# Patient Record
Sex: Female | Born: 1970 | Hispanic: No | Marital: Married | Smoking: Never smoker
Health system: Southern US, Community
[De-identification: ages and names within clinical notes are randomized; demographics above are authoritative.]

## PROBLEM LIST (undated history)

## (undated) DIAGNOSIS — I10 Essential (primary) hypertension: Secondary | ICD-10-CM

## (undated) DIAGNOSIS — E785 Hyperlipidemia, unspecified: Secondary | ICD-10-CM

## (undated) HISTORY — DX: Hyperlipidemia, unspecified: E78.5

## (undated) HISTORY — DX: Essential (primary) hypertension: I10

## (undated) HISTORY — PX: ABDOMINAL HYSTERECTOMY: SHX81

---

## 2000-05-14 ENCOUNTER — Encounter: Admission: RE | Admit: 2000-05-14 | Discharge: 2000-05-14 | Payer: Self-pay | Admitting: Obstetrics

## 2000-05-22 ENCOUNTER — Ambulatory Visit (HOSPITAL_COMMUNITY): Admission: RE | Admit: 2000-05-22 | Discharge: 2000-05-22 | Payer: Self-pay | Admitting: Obstetrics

## 2000-06-11 ENCOUNTER — Encounter: Admission: RE | Admit: 2000-06-11 | Discharge: 2000-06-11 | Payer: Self-pay | Admitting: Obstetrics

## 2000-08-09 ENCOUNTER — Emergency Department (HOSPITAL_COMMUNITY): Admission: EM | Admit: 2000-08-09 | Discharge: 2000-08-09 | Payer: Self-pay | Admitting: Emergency Medicine

## 2012-11-30 ENCOUNTER — Emergency Department (HOSPITAL_COMMUNITY): Payer: Self-pay

## 2012-11-30 ENCOUNTER — Encounter (HOSPITAL_COMMUNITY): Payer: Self-pay | Admitting: Emergency Medicine

## 2012-11-30 ENCOUNTER — Emergency Department (HOSPITAL_COMMUNITY)
Admission: EM | Admit: 2012-11-30 | Discharge: 2012-11-30 | Disposition: A | Discharge status: Home / Self Care | Payer: Self-pay | Attending: Emergency Medicine | Admitting: Emergency Medicine

## 2012-11-30 DIAGNOSIS — IMO0002 Reserved for concepts with insufficient information to code with codable children: Secondary | ICD-10-CM | POA: Insufficient documentation

## 2012-11-30 DIAGNOSIS — M549 Dorsalgia, unspecified: Secondary | ICD-10-CM

## 2012-11-30 MED ORDER — CYCLOBENZAPRINE HCL 10 MG PO TABS
10.0000 mg | ORAL_TABLET | Freq: Once | ORAL | Status: AC
  Administered 2012-11-30: 10 mg via ORAL
  Filled 2012-11-30: qty 1

## 2012-11-30 MED ORDER — HYDROCODONE-ACETAMINOPHEN 5-325 MG PO TABS
2.0000 | ORAL_TABLET | Freq: Once | ORAL | Status: AC
  Administered 2012-11-30: 2 via ORAL
  Filled 2012-11-30: qty 2

## 2012-11-30 MED ORDER — CYCLOBENZAPRINE HCL 10 MG PO TABS: 10.0000 mg | ORAL_TABLET | Freq: Two times a day (BID) | ORAL | Status: DC | PRN

## 2012-11-30 MED ORDER — NAPROXEN 500 MG PO TABS: 500.0000 mg | ORAL_TABLET | Freq: Two times a day (BID) | ORAL | Status: DC

## 2012-11-30 NOTE — ED Notes (Signed)
[employer] in room with patient  
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
## 2012-11-30 NOTE — ED Notes (Signed)
Patient in radiology

## 2012-11-30 NOTE — ED Provider Notes (Signed)
  Medical screening examination/treatment/procedure(s) were performed by non-physician practitioner and as supervising physician I was immediately available for consultation/collaboration.      Gerhard Munch, MD 11/30/12 [phone]

## 2012-11-30 NOTE — ED Notes (Signed)
Called security to have [employer] office come by and speak with patient.

## 2013-01-20 ENCOUNTER — Emergency Department (HOSPITAL_COMMUNITY)
Admission: EM | Admit: 2013-01-20 | Discharge: 2013-01-20 | Disposition: A | Discharge status: Home / Self Care | Payer: Self-pay | Attending: Emergency Medicine | Admitting: Emergency Medicine

## 2013-01-20 ENCOUNTER — Encounter (HOSPITAL_COMMUNITY): Payer: Self-pay | Admitting: Emergency Medicine

## 2013-01-20 DIAGNOSIS — M62838 Other muscle spasm: Secondary | ICD-10-CM | POA: Insufficient documentation

## 2013-01-20 DIAGNOSIS — M542 Cervicalgia: Secondary | ICD-10-CM | POA: Insufficient documentation

## 2013-01-20 DIAGNOSIS — S46819A Strain of other muscles, fascia and tendons at shoulder and upper arm level, unspecified arm, initial encounter: Secondary | ICD-10-CM

## 2013-01-20 DIAGNOSIS — S239XXA Sprain of unspecified parts of thorax, initial encounter: Secondary | ICD-10-CM | POA: Insufficient documentation

## 2013-01-20 MED ORDER — CYCLOBENZAPRINE HCL 10 MG PO TABS: 10.0000 mg | ORAL_TABLET | Freq: Two times a day (BID) | ORAL | Status: DC | PRN

## 2013-01-20 MED ORDER — NAPROXEN 500 MG PO TABS: 500.0000 mg | ORAL_TABLET | Freq: Two times a day (BID) | ORAL | Status: DC
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
## 2013-01-20 NOTE — Discharge Instructions (Signed)
1. Medications: flexeril, naproxyn, usual home medications 2. Treatment: rest, drink plenty of fluids, gentle stretching as discussed, alternate ice and heat 3. Follow Up: Please followup with your primary doctor for discussion of your diagnoses and further evaluation after today's visit; if you do not have a primary care doctor use the resource guide provided to find one;
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
## 2013-01-20 NOTE — ED Notes (Signed)
Spanish interpreter used for discharge
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
## 2013-01-21 NOTE — ED Provider Notes (Signed)
Medical screening examination/treatment/procedure(s) were performed by non-physician practitioner and as supervising physician I was immediately available for consultation/collaboration.  EKG Interpretation   None         Dagmar HaitWilliam Joel Cowin, MD 01/21/13 [phone]

## 2013-02-16 ENCOUNTER — Encounter (INDEPENDENT_AMBULATORY_CARE_PROVIDER_SITE_OTHER): Payer: Self-pay

## 2013-02-16 ENCOUNTER — Ambulatory Visit (INDEPENDENT_AMBULATORY_CARE_PROVIDER_SITE_OTHER): Payer: Self-pay | Admitting: Emergency Medicine

## 2013-02-16 ENCOUNTER — Encounter: Payer: Self-pay | Admitting: Emergency Medicine

## 2013-02-16 VITALS — BP 174/94 | HR 86 | Wt 207.0 lb

## 2013-02-16 DIAGNOSIS — M62838 Other muscle spasm: Secondary | ICD-10-CM

## 2013-02-16 MED ORDER — NAPROXEN 500 MG PO TABS: 500.0000 mg | ORAL_TABLET | Freq: Two times a day (BID) | ORAL | Status: DC

## 2013-02-16 MED ORDER — METHOCARBAMOL 500 MG PO TABS: 500.0000 mg | ORAL_TABLET | Freq: Four times a day (QID) | ORAL | Status: DC

## 2013-02-16 NOTE — Assessment & Plan Note (Signed)
Patient given prescription for Robaxin and Naprosyn. In addition she was given home exercise programs for both general and shoulder strengthening as well as back strengthening to help alleviate the muscle spasm in her trapezius muscle. Finally she was given a protrusion for physical therapy. Was also recommended that she [facility] with [facility] to consider obtaining an orange card for assistance with her health insurance.
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
## 2013-03-01 ENCOUNTER — Ambulatory Visit: Payer: Self-pay | Attending: Emergency Medicine | Admitting: Physical Therapy

## 2014-01-20 ENCOUNTER — Ambulatory Visit: Payer: Self-pay

## 2014-04-19 ENCOUNTER — Ambulatory Visit: Payer: Self-pay

## 2017-04-10 LAB — GLUCOSE, POCT (MANUAL RESULT ENTRY): POC Glucose: 88 mg/dl (ref 70–99)

## 2019-03-27 ENCOUNTER — Other Ambulatory Visit: Payer: Self-pay

## 2019-03-27 DIAGNOSIS — Z20822 Contact with and (suspected) exposure to covid-19: Secondary | ICD-10-CM

## 2019-03-28 LAB — NOVEL CORONAVIRUS, NAA: SARS-CoV-2, NAA: DETECTED — AB

## 2019-03-29 ENCOUNTER — Telehealth: Payer: Self-pay | Admitting: Unknown Physician Specialty

## 2019-03-29 ENCOUNTER — Telehealth: Payer: Self-pay | Admitting: *Deleted

## 2019-03-29 ENCOUNTER — Other Ambulatory Visit: Payer: Self-pay | Admitting: Adult Health

## 2019-03-29 DIAGNOSIS — U071 COVID-19: Secondary | ICD-10-CM

## 2019-03-29 NOTE — Progress Notes (Signed)
  I connected by phone with Kristen Peters via spanish interpreter [phone], on 03/29/2019 at 6:56 PM to discuss the potential use of an new treatment for mild to moderate COVID-19 viral infection in non-hospitalized patients.  This patient is a 49 y.o. female that meets the FDA criteria for Emergency Use Authorization of bamlanivimab or casirivimab\imdevimab.  Has a (+) direct SARS-CoV-2 viral test result  Has mild or moderate COVID-19   Is ? 51 years of age and weighs ? 40 kg  Is NOT hospitalized due to COVID-19  Is NOT requiring oxygen therapy or requiring an increase in baseline oxygen flow rate due to COVID-19  Is within 10 days of symptom onset  Has at least one of the high risk factor(s) for progression to severe COVID-19 and/or hospitalization as defined in EUA.  Specific high risk criteria : BMI >/= 35   Symptom onset 3/20  Tested positive on 3/21   I have spoken and communicated the following to the patient or parent/caregiver:  1. FDA has authorized the emergency use of bamlanivimab and casirivimab\imdevimab for the treatment of mild to moderate COVID-19 in adults and pediatric patients with positive results of direct SARS-CoV-2 viral testing who are 7 years of age and older weighing at least 40 kg, and who are at high risk for progressing to severe COVID-19 and/or hospitalization.  2. The significant known and potential risks and benefits of bamlanivimab and casirivimab\imdevimab, and the extent to which such potential risks and benefits are unknown.  3. Information on available alternative treatments and the risks and benefits of those alternatives, including clinical trials.  4. Patients treated with bamlanivimab and casirivimab\imdevimab should continue to self-isolate and use infection control measures (e.g., wear mask, isolate, social distance, avoid sharing personal items, clean and disinfect "high touch" surfaces, and frequent handwashing) according to CDC  guidelines.   5. The patient or parent/caregiver has the option to accept or refuse bamlanivimab or casirivimab\imdevimab .  After reviewing this information with the patient, The patient agreed to proceed with receiving the bamlanimivab infusion and will be provided a copy of the Fact sheet prior to receiving the infusion.Noreene Filbert 03/29/2019 6:56 PM

## 2019-03-29 NOTE — Telephone Encounter (Signed)
Assisted by Audelia Hives, Interpreter # [phone]; Pt given result of COVID test; tier of symptoms reviewed; instructions given to pt: Self-Isolation, Ending Isolation, ED/UC . remain in self-quarantine until they meet the "Non-Test Criteria for Ending Self-Isolation". Non-Test Criteria for Ending Self-Isolation All persons with fever and respiratory symptoms should isolate themselves until ALL conditions listed below are met: - at least 10 days since symptoms onset - AND 3 consecutive days fever free without antipyretics (acetaminophen [Tylenol] or ibuprofen [Advil]) - AND improvement in respiratory symptoms . If the patient develops respiratory issues/distress, seek medical care in the Emergency Department, call 911, reports symptoms and report COVID-19 positive test. Patient Instructions . continue to utilize over-the-counter medications for fever (ibuprofen and/or Tylenol) and cough (cough medicine and/or sore throat lozenges). Wannetta Sender the patient to wear a mask around people and follow good infection prevention techniques. . Patient to should only leave home to seek medical care. . send family for food, prescriptions or medicines; or use delivery service.  . If the patient must leave the home, they must wear a mask in public. Marland Kitchen limit contact with immediate family members or caregivers in the home, and use mask, social distancing, and handwashing to decrease risk to patients. o Please continue good preventive care measures, including frequent hand washing, avoid touching your face, cover coughs/sneezes with tissue or into elbow, stay out of crowds and keep a 6-foot distance from others.    patient and family to clean hard surfaces touched by patient frequently with household cleaning products. Pt advised to notify her PCP; she was also informed the [place] was notified; she verbalized understanding; she would like to know when she can be vaccinated; pt advised that she can not be  vaccinated until she is out of her quarantine period, and she is not having symptoms; she again verbalized understanding; [facility] Dept previously notified.

## 2019-03-29 NOTE — Telephone Encounter (Signed)
Using language interpreter called to discuss with patient about Covid symptoms and the use of bamlanivimab, a monoclonal antibody infusion for those with mild to moderate Covid symptoms and at a high risk of hospitalization. Not enough information to see if patient is qualified for mab.  Did not leave message as using interpreter

## 2019-03-30 ENCOUNTER — Ambulatory Visit (HOSPITAL_COMMUNITY)
Admission: RE | Admit: 2019-03-30 | Discharge: 2019-03-30 | Disposition: A | Discharge status: Home / Self Care | Payer: HRSA Program | Source: Ambulatory Visit | Attending: Pulmonary Disease | Admitting: Pulmonary Disease

## 2019-03-30 DIAGNOSIS — U071 COVID-19: Secondary | ICD-10-CM | POA: Diagnosis present

## 2019-03-30 MED ORDER — ALBUTEROL SULFATE HFA 108 (90 BASE) MCG/ACT IN AERS: 2.0000 | INHALATION_SPRAY | Freq: Once | RESPIRATORY_TRACT | Status: DC | PRN

## 2019-03-30 MED ORDER — EPINEPHRINE 0.3 MG/0.3ML IJ SOAJ: 0.3000 mg | Freq: Once | INTRAMUSCULAR | Status: DC | PRN

## 2019-03-30 MED ORDER — METHYLPREDNISOLONE SODIUM SUCC 125 MG IJ SOLR: 125.0000 mg | Freq: Once | INTRAMUSCULAR | Status: DC | PRN

## 2019-03-30 MED ORDER — SODIUM CHLORIDE 0.9 % IV SOLN
700.0000 mg | Freq: Once | INTRAVENOUS | Status: AC
  Administered 2019-03-30: 13:00:00 700 mg via INTRAVENOUS
  Filled 2019-03-30: qty 700

## 2019-03-30 MED ORDER — DIPHENHYDRAMINE HCL 50 MG/ML IJ SOLN: 50.0000 mg | Freq: Once | INTRAMUSCULAR | Status: DC | PRN

## 2019-03-30 MED ORDER — FAMOTIDINE IN NACL 20-0.9 MG/50ML-% IV SOLN: 20.0000 mg | Freq: Once | INTRAVENOUS | Status: DC | PRN

## 2019-03-30 MED ORDER — SODIUM CHLORIDE 0.9 % IV SOLN
INTRAVENOUS | Status: DC | PRN
  Administered 2019-03-30: 13:00:00 250 mL via INTRAVENOUS

## 2019-03-30 NOTE — Progress Notes (Addendum)
  Diagnosis: COVID-19  Physician: Dr Wright  Procedure: [facility]fusion [facility] Med: bamlanivimab infusion - Provided patient with bamlanimivab fact sheet for patients, parents and caregivers prior to infusion.  Complications: No immediate complications noted.  Discharge: Discharged home   Kristen Peters 03/30/2019  

## 2019-03-30 NOTE — Discharge Instructions (Signed)
COVID-19 COVID-19 El COVID-19 es una infeccin respiratoria causada por un virus llamado coronavirus tipo 2 causante del sndrome respiratorio agudo grave (SARS-CoV-2). La enfermedad tambin se conoce como enfermedad por coronavirus o nuevo coronavirus. En algunas personas, el virus puede no ocasionar sntomas. En otras, puede producir una infeccin grave. La infeccin puede empeorar rpidamente y causar complicaciones, como:  Neumona o infeccin en los pulmones.  Sndrome de dificultad respiratoria aguda o SDRA. Es una afeccin que se caracteriza por la acumulacin de lquido en los pulmones, que impide que los pulmones se llenen de aire y pasen oxgeno a la sangre.  Insuficiencia respiratoria aguda. Es una afeccin que se caracteriza porque no pasa suficiente oxgeno de los pulmones al cuerpo o porque el dixido de carbono no pasa de los pulmones hacia afuera del cuerpo.  Sepsis o choque sptico. Se trata de una reaccin grave del cuerpo ante una infeccin.  Problemas de coagulacin.  Infecciones secundarias debido a bacterias u hongos.  Falla de rganos. Ocurre cuando los rganos del cuerpo dejan de funcionar. El virus que causa el COVID-19 es contagioso. Esto significa que puede transmitirse de una persona a otra a travs de las gotitas de saliva de la tos y de los estornudos (secreciones respiratorias). Cules son las causas? Esta enfermedad es causada por un virus. Usted puede contagiarse con este virus:  Al inspirar las gotitas de una persona infectada. Las gotitas pueden diseminarse cuando una persona respira, habla, canta, tose o estornuda.  Al tocar algo, como una mesa o el picaportes de una puerta, que estuvo expuesto al virus (contaminado) y luego tocarse la boca, nariz o los ojos. Qu incrementa el riesgo? Riesgo de infeccin Es ms probable que se infecte con este virus si:  Se encuentra a una distancia menor a 6 pies (2 metros) de una persona con COVID-19.  Cuida o  vive con una persona infectada con COVID-19.  Pasa tiempo en espacios interiores repletos de gente o vive en viviendas compartidas. Riesgo de enfermedad grave Es ms probable que se enferme gravemente por el virus si:  Tiene 50aos o ms. Cuanto mayor sea su edad, mayor ser el riesgo de tener una enfermedad grave.  Vive en un hogar de ancianos o centro de atencin a largo plazo.  Tiene cncer.  Tiene una enfermedad prolongada (crnica), como las siguientes: ? Enfermedad pulmonar crnica, que incluye la enfermedad pulmonar obstructiva crnica o asma. ? Una enfermedad crnica que disminuye la capacidad del cuerpo para combatir las infecciones (immunocomprometido). ? Enfermedad cardaca, que incluye insuficiencia cardaca, una afeccin que se caracteriza porque las arterias que llegan al corazn se estrechan u obstruyen (arteriopata coronaria) o una enfermedad que provoca que el msculo cardaco se engrose, se debilite o endurezca (miocardiopata). ? Diabetes. ? Enfermedad renal crnica. ? Anemia drepanoctica, una enfermedad que se caracteriza porque los glbulos rojos tienen una forma anormal de "hoz". ? Enfermedad heptica.  Es obeso. Cules son los signos o sntomas? Los sntomas de esta afeccin pueden ser de leves a graves. Los sntomas pueden aparecer en el trmino de 2 a 14 das despus de haber estado expuesto al virus. Incluyen los siguientes:  Fiebre o escalofros.  Tos.  Dificultad para respirar.  Dolores de cabeza, dolores en el cuerpo o dolores musculares.  Secrecin o congestin nasal.  Dolor de garganta.  Nueva prdida del sentido del gusto o del olfato. Algunas personas tambin pueden tener problemas estomacales, como nuseas, vmitos o diarrea. Es posible que otras personas no tengan sntomas de COVID-19.   Cmo se diagnostica? Esta afeccin se puede diagnosticar en funcin de lo siguiente:  Sus signos y sntomas, especialmente si: ? Vive en una zona  donde hay un brote de COVID-19. ? Viaj recientemente a una zona donde el virus es frecuente. ? Cuida o vive con una persona a quien se le diagnostic COVID-19. ? Estuvo expuesto a una persona a la que se le diagnostic COVID-19.  Un examen fsico.  Anlisis de laboratorio que pueden incluir: ? Tomar una muestra de lquido de la parte posterior de la nariz y la garganta (lquido nasofarngeo), la nariz o la garganta, con un hisopo. ? Una muestra de mucosidad de los pulmones (esputo). ? Anlisis de sangre.  Los estudios de diagnstico por imgenes pueden incluir radiografas, exploracin por tomografa computarizada (TC) o ecografa. Cmo se trata? En este momento, no hay ningn medicamento para tratar el COVID-19. Los medicamentos para tratar otras enfermedades se usan a modo de ensayo para comprobar si son eficaces contra el COVID-19. El mdico le informar sobre las maneras de tratar los sntomas. En la mayora de las personas, la infeccin es leve y puede controlarse en el hogar con reposo, lquidos y medicamentos de venta libre. El tratamiento para una infeccin grave suele realizarse en la unidad de cuidados intensivos (UCI) de un hospital. Puede incluir uno o ms de los siguientes. Estos tratamientos se administran hasta que los sntomas mejoran.  Recibir lquidos y medicamentos a travs de una va intravenosa.  Oxgeno complementario. Para administrar oxgeno extra, se utiliza un tubo en la nariz, una mascarilla o una campana de oxgeno.  Colocarlo para que se recueste boca abajo (decbito prono). Esto facilita el ingreso de oxgeno a los pulmones.  Uso continuo de una mquina de presin positiva de las vas areas (CPAP) o de presin positiva de las vas areas de dos niveles (BPAP). Este tratamiento utiliza una presin de aire leve para mantener las vas respiratorias abiertas. Un tubo conectado a un motor administra oxgeno al cuerpo.  Respirador. Este tratamiento mueve el aire  dentro y fuera de los pulmones mediante el uso de un tubo que se coloca en la trquea.  Traqueostoma. En este procedimiento se hace un orificio en el cuello para insertar un tubo de respiracin.  Oxigenacin por membrana extracorprea (OMEC). En este procedimiento, los pulmones tienen la posibilidad de recuperarse al asumir las funciones del corazn y los pulmones. Suministra oxgeno al cuerpo y elimina el dixido de carbono. Siga estas instrucciones en su casa: Estilo de vida  Si est enfermo, qudese en su casa, excepto para obtener atencin mdica. El mdico le indicar cunto tiempo debe quedarse en casa. Llame al mdico antes de buscar atencin mdica.  Haga reposo en su casa como se lo haya indicado el mdico.  No consuma ningn producto que contenga nicotina o tabaco, como cigarrillos, cigarrillos electrnicos y tabaco de mascar. Si necesita ayuda para dejar de fumar, consulte al mdico.  Retome sus actividades normales segn lo indicado por el mdico. Pregntele al mdico qu actividades son seguras para usted. Instrucciones generales  Use los medicamentos de venta libre y los recetados solamente como se lo haya indicado el mdico.  Beba suficiente lquido como para mantener la orina de color amarillo plido.  Concurra a todas las visitas de seguimiento como se lo haya indicado el mdico. Esto es importante. Cmo se evita?  No hay ninguna vacuna que ayude a prevenir la infeccin por COVID-19. Sin embargo, hay medidas que puede tomar para protegerse y proteger a   otras personas de este virus. Para protegerse:   No viaje a zonas donde el COVID-19 sea un riesgo. Las zonas donde se informa la presencia del COVID-19 cambian con frecuencia. Para identificar las zonas de alto riesgo y las restricciones de viaje, consulte el sitio web de viajes de los Centers for Disease Control and Prevention (CDC) (Centros para el Control y la Prevencin de Enfermedades):  wwwnc.cdc.gov/travel/notices  Si vive o debe viajar a una zona donde el COVID-19 es un riesgo, tome precauciones para evitar infecciones. ? Aljese de las personas enfermas. ? Lvese las manos frecuentemente con agua y jabn durante 20segundos. Use desinfectante para manos con alcohol si no dispone de agua y jabn. ? Evite tocarse la boca, la cara, los ojos o la nariz. ? Evite salir de su casa, siga las indicaciones de su estado y de las autoridades sanitarias locales. ? Si debe salir de su casa, use un barbijo de tela o una mascarilla facial. Asegrese de que le cubra la nariz y la boca. ? Evite los espacios interiores repletos de gente. Mantenga una distancia de al menos 6 pies (2 metros) de otras personas. ? Desinfecte los objetos y las superficies que se tocan con frecuencia todos los das. Pueden incluir:  Encimeras y mesas.  Picaportes e interruptores de luz.  Lavabos, fregaderos y grifos.  Aparatos electrnicos tales como telfonos, controles remotos, teclados, computadoras y tabletas. Cmo proteger a los dems: Si tiene sntomas de COVID-19, tome medidas para evitar que el virus se propague a otras personas.  Si cree que tiene una infeccin por COVID-19, comunquese de inmediato con su mdico. Informe al equipo de atencin mdica que cree que puede tener una infeccin por el COVID-19.  Qudese en su casa. Salga de su casa solo para buscar atencin mdica. No utilice el transporte pblico.  No viaje mientras est enfermo.  Lvese las manos frecuentemente con agua y jabn durante 20segundos. Usar desinfectante para manos con alcohol si no dispone de agua y jabn.  Mantngase alejado de quienes vivan con usted. Permita que los miembros de la familia sanos cuiden a los nios y las mascotas, si es posible. Si tiene que cuidar a los nios o las mascotas, lvese las manos con frecuencia y use un barbijo. Si es posible, permanezca en su habitacin, separado de los dems. Utilice un  bao diferente.  Asegrese de que todas las personas que viven en su casa se laven bien las manos y con frecuencia.  Tosa o estornude en un pauelo de papel o sobre su manga o codo. No tosa o estornude al aire ni se cubra la boca o la nariz con la mano.  Use un barbijo de tela o una mascarilla facial. Asegrese de que le cubra la nariz y la boca. Dnde buscar ms informacin  Centers for Disease Control and Prevention (Centros para el Control y la Prevencin de Enfermedades): www.cdc.gov/coronavirus/2019-ncov/index.html  World Health Organization (Organizacin Mundial de la Salud): www.who.int/health-topics/coronavirus Comunquese con un mdico si:  Vive o ha viajado a una zona donde el COVID-19 es un riesgo y tiene sntomas de infeccin.  Ha tenido contacto con alguien que tiene COVID-19 y usted tiene sntomas de infeccin. Solicite ayuda inmediatamente si:  Tiene dificultad para respirar.  Siente dolor u opresin en el pecho.  Experimenta confusin.  Tiene las uas de los dedos y los labios de color azulado.  Tiene dificultad para despertarse.  Los sntomas empeoran. Estos sntomas pueden representar un problema grave que constituye una emergencia. No espere   a ver si los sntomas desaparecen. Solicite atencin mdica de inmediato. Comunquese con el servicio de emergencias de su localidad (911 en los [place]). No conduzca por sus propios medios hasta el hospital. Informe al personal mdico de emergencias si cree que tiene COVID-19. Resumen  El COVID-19 es una infeccin respiratoria causada por un virus. Tambin se conoce como enfermedad por coronavirus o nuevo coronavirus. Puede causar infecciones graves, como neumona, sndrome de dificultad respiratoria aguda, insuficiencia respiratoria aguda o sepsis.  El virus que causa el COVID-19 es contagioso. Esto significa que puede transmitirse de una persona a otra a travs de las gotitas que se despiden al respirar, hablar,  cantar, toser y estornudar.  Es ms probable que desarrolle una enfermedad grave si tiene 50 aos o ms, tiene el sistema inmunitario dbil, vive en un hogar de ancianos o tiene una enfermedad crnica.  No hay ningn medicamento para tratar el COVID-19. El mdico le informar sobre las maneras de tratar los sntomas.  Tome medidas para protegerse y proteger a los dems contra las infecciones. Lvese las manos con frecuencia y desinfecte los objetos y las superficies que se tocan con frecuencia todos los das. Mantngase alejado de las personas que estn enfermas y use un barbijo si est enfermo. Esta informacin no tiene como fin reemplazar el consejo del mdico. Asegrese de hacerle al mdico cualquier pregunta que tenga. Document Revised: 10/28/2018 Document Reviewed: 02/20/2018 Elsevier Patient Education  2020 Elsevier Inc. What types of side effects do monoclonal antibody drugs cause?  Common side effects  In general, the more common side effects caused by monoclonal antibody drugs include: . Allergic reactions, such as hives or itching . Flu-like signs and symptoms, including chills, fatigue, fever, and muscle aches and pains . Nausea, vomiting . Diarrhea . Skin rashes . Low blood pressure   The CDC is recommending patients who receive monoclonal antibody treatments wait at least 90 days before being vaccinated.  Currently, there are no data on the safety and efficacy of mRNA COVID-19 vaccines in persons who received monoclonal antibodies or convalescent plasma as part of COVID-19 treatment. Based on the estimated half-life of such therapies as well as evidence suggesting that reinfection is uncommon in the 90 days after initial infection, vaccination should be deferred for at least 90 days, as a precautionary measure until additional information becomes available, to avoid interference of the antibody treatment with vaccine-induced immune responses.  

## 2019-03-30 NOTE — Progress Notes (Signed)
Patient check-in and procedure explanation completed with assistance from interpreter line.  Interpreter, [place], [place] [address].  Arlyce Harman

## 2019-05-23 ENCOUNTER — Other Ambulatory Visit: Payer: Self-pay

## 2019-05-23 ENCOUNTER — Ambulatory Visit: Payer: Self-pay | Admitting: Physician Assistant

## 2019-05-23 VITALS — BP 143/84 | HR 76 | Temp 98.7°F | Resp 18 | Ht 62.0 in

## 2019-05-23 DIAGNOSIS — Z13220 Encounter for screening for lipoid disorders: Secondary | ICD-10-CM

## 2019-05-23 DIAGNOSIS — Z8639 Personal history of other endocrine, nutritional and metabolic disease: Secondary | ICD-10-CM

## 2019-05-23 DIAGNOSIS — Z1159 Encounter for screening for other viral diseases: Secondary | ICD-10-CM

## 2019-05-23 DIAGNOSIS — I10 Essential (primary) hypertension: Secondary | ICD-10-CM

## 2019-05-23 DIAGNOSIS — Z5989 Other problems related to housing and economic circumstances: Secondary | ICD-10-CM

## 2019-05-23 LAB — POCT GLYCOSYLATED HEMOGLOBIN (HGB A1C): Hemoglobin A1C: 5.4 % (ref 4.0–5.6)

## 2019-05-23 MED ORDER — LISINOPRIL-HYDROCHLOROTHIAZIDE 10-12.5 MG PO TABS: 1.0000 | ORAL_TABLET | Freq: Every day | ORAL | 2 refills | Status: DC

## 2019-05-23 NOTE — Patient Instructions (Addendum)
Please return to the mobile unit tomorrow morning at 9 AM for fasting labs.  I sent the blood pressure medication to your pharmacy, I recommend that you take this starting in the morning, you do not want to take this later in the day because it can increase the amount of times that you have to use the bathroom.  For your sleep I recommend that you try melatonin over-the-counter, make sure your room is cool, dark and quiet, no electronics.  Thank you for trusting [place] with your care!  Roney Jaffe, PA-C Physician Assistant [facility] [email or web address]    La menopausia Menopause La menopausia es el perodo normal de la vida en el que los perodos menstruales cesan por completo. Por lo general, se confirma tras de no haber tenido un perodo menstrual. La transicin a la menopausia (perimenopausia), con mayor frecuencia, ocurre entre los 45 y los 55aos. Durante la perimenopausia, los niveles hormonales cambian en el [place], lo que puede provocar sntomas y Ship broker. La menopausia podra aumentar el riesgo de sufrir lo siguiente:  Prdida de masa sea (osteoporosis), lo que predispone a la rotura de huesos (fracturas).  Depresin.  Endurecimiento y Scientist, research (medical) de las arterias (aterosclerosis), lo que puede causar infartos de miocardio y accidentes cerebrovasculares. Cules son las causas? A menudo, la causa de esta afeccin es un cambio natural en los niveles hormonales, que ocurre a medida que envejece. La afeccin tambin podra ser provocada por una ciruga en la que se extraigan ambos ovarios (ooforectoma bilateral). Qu incrementa el riesgo? Es ms probable que esta afeccin comience a una temprana edad si tiene ciertas afecciones o se realiza ciertos tratamientos, incluidos los siguientes:  Un tumor en la hipfisis del cerebro.  Una enfermedad que afecte los ovarios y la produccin de  hormonas.  Radioterapia para tratar Management consultant.  Ciertos tratamientos contra el cncer, como quimioterapia o terapia hormonal (antiestrgeno).  Fumar mucho y consumir alcohol de forma excesiva.  Antecedentes familiares de menopausia temprana. Adems, es ms probable que esta afeccin se presente de manera temprana en las mujeres que son [place]. Cules son los signos o los sntomas? Los sntomas de esta afeccin incluyen los siguientes:  Engineer, maintenance.  Perodos menstruales irregulares.  Sudoracin nocturna.  Cambios en el sentimiento respecto de las [employer]. Es posible que el deseo sexual disminuya o que se sienta ms cmoda respecto de su sexualidad.  Sequedad vaginal y adelgazamiento de las paredes de la vagina. Esto podra hacer que sienta dolor durante las relaciones sexuales.  Sequedad de la piel y aparicin de Banker.  Dolores de [place].  Problemas para dormir (insomnio).  Cambios de humor o irritabilidad.  Problemas de memoria.  Aumento de [place].  Crecimiento de bello en la cara y el pecho.  Infecciones en la vejiga o problemas para orinar. Cmo se diagnostica? Esta afeccin se diagnostica en funcin de los antecedentes mdicos, un examen fsico, la edad, los antecedentes menstruales y los sntomas. Tambin le podran realizar estudios hormonales. Cmo se trata? En algunos los casos, no se necesita tratamiento. Usted y el mdico deben decidir juntos si se debe Pensions consultant. El tratamiento se determinar en funcin de su cuadro clnico y de sus preferencias. El tratamiento de este cuadro clnico se centra en el control de los sntomas. El tratamiento puede incluir lo siguiente:  Terapia hormonal para la menopausia.  Medicamentos para tratar sntomas o complicaciones especficos.  Acupuntura.  Vitaminas o suplementos herbales. Antes de comenzar el  tratamiento, es importante que le avise al mdico si tiene antecedentes personales o  familiares de lo siguiente:  Enfermedades cardacas.  Cncer de mama.  Cogulos de [place].  Diabetes.  Osteoporosis. Siga estas indicaciones en su casa: Estilo de vida  No consuma ningn producto que contenga nicotina o tabaco, como cigarrillos y Administrator, Civil Service. Si necesita ayuda para dejar de fumar, consulte al mdico.  Realice, por lo menos, de actividad fsica 5das por semana o ms.  Evite las bebidas con alcohol o cafena, as como las [employer] Esto podra ayudar a prevenir los acaloramientos.  Intente dormir de 7 a 8horas todas las noches.  Si tiene acaloramientos: ? Vstase en capas. ? Evite las cosas que podran [employer] acaloramientos, como las comidas muy condimentadas, los lugares calientes o el estrs. ? Respire profundamente y despacio cuando comience a Scientist, water quality. ? Tenga un ventilador en su casa y en su oficina.  Encuentre modos de [employer], por ejemplo, a travs de la respiracin, meditacin o un diario ntimo.  Considere la posibilidad de asistir a terapia grupal con otras mujeres que tengan sntomas de [place]. [place] recomendaciones al [employer] reuniones de terapia grupal. Comida y bebida  Siga una dieta saludable y equilibrada que incluya cereales integrales, protenas magras, productos lcteos descremados y [place] frutas y verduras.  El mdico podra recomendarle que agregue una mayor cantidad de soja a su dieta. Algunos de los alimentos que contienen soja son el tofu, el tempeh y la [place] de soja.  Consuma muchos alimentos que contengan calcio y vitaminaD para Nutritional therapist salud sea. Algunos productos que contienen mucho calcio son los [employer], el yogur, los frijoles, las [place], las sardinas, el brcoli y la col rizada. Medicamentos  [employer] de venta libre y los recetados solamente como se lo haya indicado el mdico.  Hable con el mdico antes de  Corporate investment banker a tomar cualquier suplemento herbal. Tome las vitaminas y los suplementos recetados como se lo haya indicado el mdico. Estos pueden incluir lo siguiente: ? Calcio. Las mujeres de 51aos o ms deben consumir 1200mg  (miligramos) de . ? VitaminaD. Las mujeres necesitan entre 600 y 800unidades internacionales de vitaminaD todos [place]. ? VitaminasB12 yB6. Procure consumir [place] de vitaminaB12 y 1,5mg  de vitaminaB6 todos los . Instrucciones generales  Lleve un registro de sus perodos menstruales; incluya lo siguiente: ? El momento en que ocurren. ? Qu tan abundantes son y cunto duran. ? Cunto tiempo transcurre entre cada perodo menstrual.  Lleve un registro de los sntomas; anote cundo comienzan, con qu frecuencia ocurren y cunto duran.  Use lubricantes o humectantes vaginales para aliviar la sequedad vaginal y [address] durante las relaciones sexuales.  Concurra a todas las visitas de control como se lo haya indicado el mdico. Esto es importante. Esto incluye la terapia grupal y la psicoterapia. Comunquese con un mdico si:  An tiene perodos menstruales despus de los 55aos.  Siente dolor durante las Personnel officer.  No tuvo perodos menstruales durante los ltimos [employer] y presenta sangrado vaginal. Solicite ayuda de inmediato si:  Tiene los siguientes sntomas: ? Depresin grave. ? Sangrado vaginal excesivo. ? Dolor al . ? Latidos cardacos rpidos o irregulares (palpitaciones). ? Dolor de cabeza intenso. ? Dolor en el abdomen (abdominal) o dispepsia grave.  Se cay y cree que se ha fracturado un hueso.  Siente dolor en el pecho o en la pierna.  Desarrolla problemas de visin.  Se  toca un bulto en la mama. Resumen  La menopausia es el perodo normal de la vida en el que los perodos menstruales cesan por completo. Por lo general, se confirma tras 66meses de no haber tenido un perodo  menstrual.  La transicin a la menopausia (perimenopausia), con mayor frecuencia, ocurre entre los 14 y los 55aos.  Los sntomas pueden controlarse mediante medicamentos, cambios en el estilo de vida y terapias complementarias, como acupuntura.  Siga una dieta equilibrada que incluya muchos nutrientes para Psychologist, counselling salud sea y la salud cardaca, y para Chief Technology Officer los sntomas durante la [place]. Esta informacin no tiene Marine scientist el consejo del mdico. Asegrese de hacerle al mdico cualquier pregunta que tenga. Document Revised: 09/18/2016 Document Reviewed: 09/18/2016 Elsevier Patient Education  [place].

## 2019-05-23 NOTE — Progress Notes (Signed)
New Patient Office Visit  Subjective:  Patient ID: Kristen Peters, female    DOB: 1970-02-25  Age: 49 y.o. MRN: [id number]  CC:  Chief Complaint  Patient presents with  . Hypertension    HPI Kristen Peters reports that she was diagnosed with hypertension, states that she has been out of her medications for the past month, states that it was approximately 1 year ago when she was diagnosed and started on blood pressure medications.  States that she is unsure of what medication she was taking.  Reports that she does check her blood pressure at home, states it has been elevated since that she has been out of her medications, is unsure of the reading.  Reports that she follows a low-sodium diet.  Reports that she has been having difficulty falling asleep and staying asleep, states this has been ongoing for the past few months, states that she wakes several times throughout the night, has not tried anything for relief.  Reports that she feels that she is going through menopause, states that she has had increased hot flushes over the last 3 years.  Endorses history of hysterectomy when she was 49 years old.  Reports that she takes a daily multivitamin  Due to language barrier, an interpreter was present during the history-taking and subsequent discussion (and for part of the physical exam) with this patient.    History reviewed. No pertinent past medical history.  Past Surgical History:  Procedure Laterality Date  . ABDOMINAL HYSTERECTOMY      History reviewed. No pertinent family history.  Social History   Socioeconomic History  . Marital status: Married    Spouse name: Not on file  . Number of children: Not on file  . Years of education: Not on file  . Highest education level: Not on file  Occupational History  . Not on file  Tobacco Use  . Smoking status: Never Smoker  . Smokeless tobacco: Never Used  Substance and Sexual Activity  . Alcohol use: No  . Drug use: No   . Sexual activity: Yes  Other Topics Concern  . Not on file  Social History Narrative  . Not on file   Social Determinants of Health   Financial Resource Strain:   . Difficulty of Paying Living Expenses:   Food Insecurity:   . Worried About Charity fundraiser in the Last Year:   . Arboriculturist in the Last Year:   Transportation Needs:   . Film/video editor (Medical):   Marland Kitchen Lack of Transportation (Non-Medical):   Physical Activity:   . Days of Exercise per Week:   . Minutes of Exercise per Session:   Stress:   . Feeling of Stress :   Social Connections:   . Frequency of Communication with Friends and Family:   . Frequency of Social Gatherings with Friends and Family:   . Attends Religious Services:   . Active Member of Clubs or Organizations:   . Attends Archivist Meetings:   Marland Kitchen Marital Status:   Intimate Partner Violence:   . Fear of Current or Ex-Partner:   . Emotionally Abused:   Marland Kitchen Physically Abused:   . Sexually Abused:     ROS Review of Systems  Constitutional: Positive for fatigue.  Eyes: Negative for visual disturbance.  Respiratory: Negative for cough, shortness of breath and wheezing.   Cardiovascular: Negative for chest pain and palpitations.  Gastrointestinal: Negative.   Endocrine: Negative.   Genitourinary: Negative.  Musculoskeletal: Negative.   Skin: Negative.   Neurological: Negative for dizziness, syncope and headaches.  Hematological: Negative.   Psychiatric/Behavioral: Positive for sleep disturbance.    Objective:   Today's Vitals: BP (!) 143/84 (BP Location: Left Arm, Patient Position: Sitting, Cuff Size: Large)   Pulse 76   Temp 98.7 F (37.1 C) (Oral)   Resp 18   Ht 5\' 2"  (1.575 m)   BMI 37.86 kg/m   Physical Exam Vitals and nursing note reviewed.  Constitutional:      General: She is not in acute distress.    Appearance: Normal appearance. She is obese. She is not ill-appearing.  HENT:     Head:  Normocephalic and atraumatic.     Right Ear: External ear normal.     Left Ear: External ear normal.     Mouth/Throat:     Mouth: Mucous membranes are moist.     Pharynx: Oropharynx is clear.  Eyes:     Extraocular Movements: Extraocular movements intact.     Pupils: Pupils are equal, round, and reactive to light.  Cardiovascular:     Rate and Rhythm: Normal rate and regular rhythm.     Pulses: Normal pulses.     Heart sounds: Normal heart sounds.  Pulmonary:     Effort: Pulmonary effort is normal.     Breath sounds: Normal breath sounds.  Abdominal:     General: Abdomen is flat.     Palpations: Abdomen is soft.  Musculoskeletal:        General: Normal range of motion.     Cervical back: Normal range of motion and neck supple.  Skin:    General: Skin is warm and dry.  Neurological:     General: No focal deficit present.     Mental Status: She is alert and oriented to person, place, and time.  Psychiatric:        Mood and Affect: Mood normal.        Behavior: Behavior normal.        Thought Content: Thought content normal.        Judgment: Judgment normal.     Assessment & Plan:   Problem List Items Addressed This Visit    None    Visit Diagnoses    Essential hypertension    -  Primary   Relevant Medications   lisinopril-hydrochlorothiazide (ZESTORETIC) 10-12.5 MG tablet   Other Relevant Orders   CBC with Differential/Platelet   Comp. Metabolic Panel (12)   TSH   Microalbumin / creatinine urine ratio   Class 2 severe obesity due to excess calories with serious comorbidity and body mass index (BMI) of 37.0 to 37.9 in adult [facility])       Uninsured       History of elevated glucose       Relevant Orders   HgB A1c (Completed)   Encounter for HCV screening test for low risk patient       Relevant Orders   Hepatitis c antibody (reflex)   Screening, lipid       Relevant Orders   Lipid panel      Outpatient Encounter Medications as of 05/23/2019  Medication Sig  .  ibuprofen (ADVIL,MOTRIN) 200 MG tablet Take 400 mg by mouth every 6 (six) hours as needed for moderate pain.  05/25/2019 lisinopril-hydrochlorothiazide (ZESTORETIC) 10-12.5 MG tablet Take 1 tablet by mouth daily.  . [DISCONTINUED] cyclobenzaprine (FLEXERIL) 10 MG tablet Take 1 tablet (10 mg total) by mouth 2 (two) times  daily as needed for muscle spasms.  . [DISCONTINUED] methocarbamol (ROBAXIN) 500 MG tablet Take 1 tablet (500 mg total) by mouth 4 (four) times daily.  . [DISCONTINUED] naproxen (NAPROSYN) 500 MG tablet Take 1 tablet (500 mg total) by mouth 2 (two) times daily with a meal.   No facility-administered encounter medications on file as of 05/23/2019.   1. Essential hypertension Checked with her pharmacy, the last time she had a prescription for blood pressure medication was in 2016.  Will restart hydrochlorothiazide-lisinopril at slightly lower dose, patient to return to mobile unit in the morning for fasting labs, patient was given financial assistance through [facility] health information along with an appointment to [facility]. Encouraged patient to keep a log of blood pressure readings, bring log to next office visit F/U at [facility] on 06/21/19 to establish PCP  Gave patient education on good sleep hygiene, trial melatonin over-the-counter  - lisinopril-hydrochlorothiazide (ZESTORETIC) 10-12.5 MG tablet; Take 1 tablet by mouth daily.  Dispense: 30 tablet; Refill: 2 - CBC with Differential/Platelet; Future - Comp. Metabolic Panel (12); Future - TSH; Future - Microalbumin / creatinine urine ratio; Future  2. Class 2 severe obesity due to excess calories with serious comorbidity and body mass index (BMI) of 37.0 to 37.9 in adult (HCC)   3. Uninsured   4. History of elevated glucose  - HgB A1c  5. Encounter for HCV screening test for low risk patient  - Hepatitis c antibody (reflex); Future  6. Screening, lipid  - Lipid panel;  Future   I have reviewed the patient's medical history (PMH, PSH, Social History, Family History, Medications, and allergies) , and have been updated if relevant. I spent 30 minutes reviewing chart and  face to face time with patient.     Follow-up: Return in about 1 day (around 05/24/2019) for Fasting  labs.   Kasandra Knudsen Mayers, PA-C

## 2019-05-24 ENCOUNTER — Other Ambulatory Visit: Payer: Self-pay

## 2019-05-30 ENCOUNTER — Other Ambulatory Visit: Payer: Self-pay

## 2019-05-30 ENCOUNTER — Ambulatory Visit: Payer: Self-pay

## 2019-06-20 NOTE — Progress Notes (Signed)
Subjective:    Patient ID: Kristen Peters, female    DOB: 1970/05/21, 49 y.o.   MRN: [id number]  49 y.o.F here to est care The patient has a history of HTN. Pt had covid in March and received mAB  [facility] This patient has a history of hypertension in the past but had not been on medications in over a month when she was seen in May at the [facility]. She was represcribed Zestoretic 10/12.5 and is taking this daily. Today on arrival blood pressure is 106/70 and is markedly improved from the visit with mobile medicine. The only labs obtained at the mobile medicine visit was a normal glucose and hemoglobin A1c of 5.4 and she needs further lab follow-up here. Patient denies any prior history of known diabetes. She does have elevated weight. She is gone through menopause and she is had increased hot flashes over the past 3 years and does have prior hysterectomy at age of 27. She does take an over-the-counter multivitamin. Today the visit is accomplished with an interpreter in person his name is Dub Mikes. Patient states for breakfast she will eat eggs with either asparagus or spinach for lunch she will usually have fruit salad for supper she will eat beef or chicken or fish along with beans and tortillas  Note the patient is planning on her first Covid vaccine to occur in the next month   Past Medical History:  Diagnosis Date  . Hypertension      Family History  Problem Relation Age of Onset  . Diabetes Mother      Social History   Socioeconomic History  . Marital status: Married    Spouse name: Not on file  . Number of children: Not on file  . Years of education: Not on file  . Highest education level: Not on file  Occupational History  . Not on file  Tobacco Use  . Smoking status: Never Smoker  . Smokeless tobacco: Never Used  Substance and Sexual Activity  . Alcohol use: No  . Drug use: No  . Sexual activity: Yes  Other Topics Concern  .  Not on file  Social History Narrative  . Not on file   Social Determinants of Health   Financial Resource Strain:   . Difficulty of Paying Living Expenses:   Food Insecurity:   . Worried About Charity fundraiser in the Last Year:   . Arboriculturist in the Last Year:   Transportation Needs:   . Film/video editor (Medical):   Marland Kitchen Lack of Transportation (Non-Medical):   Physical Activity:   . Days of Exercise per Week:   . Minutes of Exercise per Session:   Stress:   . Feeling of Stress :   Social Connections:   . Frequency of Communication with Friends and Family:   . Frequency of Social Gatherings with Friends and Family:   . Attends Religious Services:   . Active Member of Clubs or Organizations:   . Attends Archivist Meetings:   Marland Kitchen Marital Status:   Intimate Partner Violence:   . Fear of Current or Ex-Partner:   . Emotionally Abused:   Marland Kitchen Physically Abused:   . Sexually Abused:      No Known Allergies   Outpatient Medications Prior to Visit  Medication Sig Dispense Refill  . lisinopril-hydrochlorothiazide (ZESTORETIC) 10-12.5 MG tablet Take 1 tablet by mouth daily. 30 tablet 2  . ibuprofen (ADVIL,MOTRIN) 200  MG tablet Take 400 mg by mouth every 6 (six) hours as needed for moderate pain. (Patient not taking: Reported on 06/21/2019)     No facility-administered medications prior to visit.     Review of Systems Constitutional:   No  weight loss, night sweats,  Fevers, chills, fatigue, lassitude. HEENT:   No headaches,  Difficulty swallowing,  Tooth/dental problems,  Sore throat,                No sneezing, itching, ear ache, nasal congestion, post nasal drip,   CV:  No chest pain,  Orthopnea, PND, swelling in lower extremities, anasarca, dizziness, palpitations  GI  No heartburn, indigestion, abdominal pain, nausea, vomiting, diarrhea, change in bowel habits, loss of appetite  Resp: No shortness of breath with exertion or at rest.  No excess mucus, no  productive cough,  No non-productive cough,  No coughing up of blood.  No change in color of mucus.  No wheezing.  No chest wall deformity  Skin: no rash or lesions.  GU: no dysuria, change in color of urine, no urgency or frequency.  No flank pain.  MS:  No joint pain or swelling.  No decreased range of motion.  No back pain.  Psych:  No change in mood or affect. No depression or anxiety.  No memory loss.     Objective:   Physical Exam Vitals:   06/21/19 1445  BP: 106/70  Pulse: 77  Temp: 97.7 F (36.5 C)  TempSrc: Temporal  SpO2: 97%  Weight: 237 lb (107.5 kg)  Height: 5' 4.17" (1.63 m)    Gen: Pleasant, obese, in no distress,  normal affect  ENT: No lesions,  mouth clear,  oropharynx clear, no postnasal drip, teeth are in good condition  Neck: No JVD, no TMG, no carotid bruits  Lungs: No use of accessory muscles, no dullness to percussion, clear without rales or rhonchi  Cardiovascular: RRR, heart sounds normal, no murmur or gallops, no peripheral edema  Abdomen: soft and NT, no HSM,  BS normal  Musculoskeletal: No deformities, no cyanosis or clubbing  Neuro: alert, non focal  Skin: Warm, no lesions or rashes         Assessment & Plan:  I personally reviewed all images and lab data in the [facility] system as well as any outside material available during this office visit and agree with the  radiology impressions.   HTN (hypertension) Hypertension under good control at this time  Plan to refill Zestoretic 10/12.51 daily and obtain CBC, complete metabolic panel and thyroid panel along with lipid panel  Obesity, Class III, BMI 40-49.9 (morbid obesity) (HCC) Went over with the patient dietary counseling and gave her diet on the AVS   Rhylan was seen today for establish care.  Diagnoses and all orders for this visit:  Essential hypertension -     Comprehensive metabolic panel -     CBC with Differential/Platelet -     Lipid panel -     Thyroid Panel With  TSH -     Discontinue: lisinopril-hydrochlorothiazide (ZESTORETIC) 10-12.5 MG tablet; Take 1 tablet by mouth daily. -     lisinopril-hydrochlorothiazide (ZESTORETIC) 10-12.5 MG tablet; Take 1 tablet by mouth daily.  Encounter for screening for HIV -     HIV Antibody (routine testing w rflx)  Colon cancer screening -     Fecal occult blood, imunochemical  Need for hepatitis C screening test -     Hepatitis c antibody (reflex)  Encounter for screening mammogram for malignant neoplasm of breast -     MM DIGITAL SCREENING BILATERAL; Future  Obesity, Class III, BMI 40-49.9 (morbid obesity) (HCC)  S/P hysterectomy with oophorectomy   We will also obtain colon cancer screening HIV hepatitis C mammogram note she has had a hysterectomy so no Pap smear is needed

## 2019-06-21 ENCOUNTER — Ambulatory Visit: Payer: Self-pay | Attending: Critical Care Medicine | Admitting: Critical Care Medicine

## 2019-06-21 ENCOUNTER — Encounter: Payer: Self-pay | Admitting: Critical Care Medicine

## 2019-06-21 ENCOUNTER — Other Ambulatory Visit: Payer: Self-pay | Admitting: Critical Care Medicine

## 2019-06-21 ENCOUNTER — Other Ambulatory Visit: Payer: Self-pay

## 2019-06-21 VITALS — BP 106/70 | HR 77 | Temp 97.7°F | Ht 64.17 in | Wt 237.0 lb

## 2019-06-21 DIAGNOSIS — Z114 Encounter for screening for human immunodeficiency virus [HIV]: Secondary | ICD-10-CM

## 2019-06-21 DIAGNOSIS — Z90721 Acquired absence of ovaries, unilateral: Secondary | ICD-10-CM

## 2019-06-21 DIAGNOSIS — Z1159 Encounter for screening for other viral diseases: Secondary | ICD-10-CM

## 2019-06-21 DIAGNOSIS — Z90710 Acquired absence of both cervix and uterus: Secondary | ICD-10-CM

## 2019-06-21 DIAGNOSIS — Z1211 Encounter for screening for malignant neoplasm of colon: Secondary | ICD-10-CM

## 2019-06-21 DIAGNOSIS — I10 Essential (primary) hypertension: Secondary | ICD-10-CM | POA: Insufficient documentation

## 2019-06-21 DIAGNOSIS — Z1231 Encounter for screening mammogram for malignant neoplasm of breast: Secondary | ICD-10-CM

## 2019-06-21 DIAGNOSIS — E66813 Obesity, class 3: Secondary | ICD-10-CM | POA: Insufficient documentation

## 2019-06-21 MED ORDER — LISINOPRIL-HYDROCHLOROTHIAZIDE 10-12.5 MG PO TABS: 1.0000 | ORAL_TABLET | Freq: Every day | ORAL | 2 refills | Status: DC

## 2019-06-21 NOTE — Patient Instructions (Signed)
Mammogram will be obtained Fecal occult card given to check for blood Labs today  Stay on lisinopril/HCT daily  Focus on weight loss, see below  See Dr Delford Field 3 months   Obesidad en los adultos Obesity, Adult La obesidad es un exceso de Art gallery manager. Ser obeso significa que su peso es ms de lo que es saludable para usted. [facility] es un nmero que indica la cantidad de grasa corporal que tiene una persona. Si usted tiene un ndice de masa corporal (IMC) de 30o ms, esto significa que es obeso. A menudo, la causa de la obesidad es comer o beber ms caloras que las que el cuerpo [place]. Cambiar el estilo de vida puede ayudarlo a Publishing copy de [place]. La obesidad puede causar problemas de salud graves, como los siguientes:  Accidente cerebrovascular.  Arteriopata coronaria (EAC).  Diabetes tipo 2.  Algunos tipos de cncer, incluido el cncer de colon, mama, tero y vescula.  Artrosis.  Presin arterial alta (hipertensin arterial).  Colesterol alto.  Apnea del sueo.  Clculos en la vescula.  Problemas de esterilidad. Cules son las causas?  Consumir todos los [facility] con altos niveles de [place], International aid/development worker y [place].  Nacer con genes que pueden hacerlo ms propenso a ser obeso.  Tener una afeccin mdica que causa obesidad.  Tomar ciertos medicamentos.  Permanecer mucho tiempo sentado (tener un estilo de vida sedentario).  No dormir lo suficiente.  Beber gran cantidad de bebidas con azcar. Qu incrementa el riesgo?  Tener antecedentes familiares de obesidad.  Ser mujer afroamericana.  Ser hombre de origen hispano.  Vivir en un rea con acceso limitado a las siguientes posibilidades: ? Parques, centros recreativos o veredas. ? Alimentos saludables, como se venden en tiendas de comestibles y mercados de Event organiser. Cules son los signos o los sntomas? El principal signo es tener demasiada grasa corporal. Cmo se trata?  El tratamiento de esta  afeccin frecuentemente incluye cambiar el estilo de vida. El tratamiento puede incluir: ? Cambios en la dieta. Esto puede incluir crear un plan de alimentacin saludable. ? Realizar actividad fsica. Puede incluir una actividad que hace que el corazn lata ms rpido (ejercicio [place]) y Fish farm manager de Pensions consultant. Trabaje con su mdico para disear un programa que funcione para usted. ? Medicamentos para ayudarlo a Curator. Pueden utilizarse si no puede perder 1 libra (450g) por semana despus de 6 semanas de alimentacin saludable y ms ejercicio. ? Tratar las afecciones que causan la obesidad. ? [place]. Las opciones pueden incluir bandas gstricas y bypass gstrico. Esto puede realizarse en las siguientes situaciones:  Otros tratamientos no mejoraron su afeccin.  Tiene un IMC de 40 o superior.  Tiene problemas de salud potencialmente mortales relacionados con la obesidad. Siga estas instrucciones en su casa: Comida y bebida   Siga las instrucciones del mdico respecto de las comidas y las bebidas. Su mdico puede recomendarle lo siguiente: ? Limitar las comidas rpidas, los dulces y las colaciones procesadas. ? Elegir opciones con bajo contenido de [place]. Por ejemplo, Counsellor de [employer]. ? Consumir 5o ms porciones de frutas o verduras por da. ? Comer en casa con ms frecuencia. Esto le da ms control sobre lo que come. ? Cuando coma afuera, elija alimentos saludables. ? Aprenda a leer las etiquetas de los alimentos. Esto le ayudar a aprender qu cantidad de alimento hay en una porcin. ? Tenga a mano colaciones con bajo contenido de grasas. ? Evite las bebidas que contengan mucha azcar. Estas  incluyen refrescos, jugo de frutas, t helado con azcar y [place] saborizada.  Beba suficiente agua para mantener el pis (la [place]) de color amarillo plido.  No siga las dietas de [place]. Actividad fsica  Haga ejercicios con frecuencia, como se lo haya  indicado el mdico. La mayora de los adultos deben hacer hasta 161minutos de ejercicio de intensidad moderada cada semana.Pregntele al mdico lo siguiente: ? Los tipos de ejercicios que son seguros para usted. ? La frecuencia con la que [employer] ejercicios.  Precaliente y elongue adecuadamente antes de hacer actividad fsica.  Haga un estiramiento lento despus de la actividad (relajacin).  Descanse entre los perodos de [place]. Estilo de vida  Trabaje con su mdico y con un experto en alimentacin (nutricionista) para establecer un objetivo de prdida de peso que sea adecuado para usted.  Limite el tiempo que pasa frente a una pantalla.  Busque formas de recompensarse que no incluyan alimentos.  No beba alcohol si: ? El mdico le indica que no lo haga. ? Est embarazada, puede estar embarazada o est tratando de quedar embarazada.  Si bebe alcohol: ? Limite la cantidad que bebe a lo siguiente:  De 0 a 1 medida por da para las mujeres.  De 0 a 2 medidas por da para los hombres. ? Est atento a la cantidad de alcohol que hay en las bebidas que toma. En los [place], una medida equivale a una botella de cerveza de 12oz (336ml), un vaso de vino de 5oz (132ml) o un vaso de una bebida alcohlica de alta graduacin de 1oz (55ml). Instrucciones generales  Lleve un diario de su prdida de peso. Esto puede ayudarlo a Conservator, museum/gallery de lo siguiente: ? Los alimentos que come. ? Cunto ejercicio realiza.  Tome los medicamentos de venta libre y los recetados solamente como se lo haya indicado el [place] vitaminas y suplementos solamente como se lo haya indicado el mdico.  Considere participar en un grupo de apoyo.  Concurra a todas las visitas de seguimiento como se lo haya indicado el mdico. Esto es importante. Comunquese con un mdico si:  No puede alcanzar su objetivo de prdida de peso despus de haber modificado su dieta y su estilo de  vida durante 6semanas. Solicite ayuda inmediatamente si:  Tiene dificultad para respirar.  Tiene pensamientos acerca de lastimarse. Resumen  La obesidad es un exceso de Air traffic controller.  Ser obeso significa que su peso es ms de lo que es saludable para usted.  Trabaje con su mdico para establecer un objetivo de prdida de [place].  Haga actividad fsica con regularidad tal como le indic el mdico. Esta informacin no tiene Marine scientist el consejo del mdico. Asegrese de hacerle al mdico cualquier pregunta que tenga. Document Revised: 09/17/2017 Document Reviewed: 09/17/2017 Elsevier Patient Education  2020 [employer].

## 2019-06-21 NOTE — Assessment & Plan Note (Signed)
Went over with the patient dietary counseling and gave her diet on the AVS

## 2019-06-21 NOTE — Assessment & Plan Note (Signed)
Hypertension under good control at this time  Plan to refill Zestoretic 10/12.51 daily and obtain CBC, complete metabolic panel and thyroid panel along with lipid panel

## 2019-06-22 LAB — COMPREHENSIVE METABOLIC PANEL
ALT: 30 IU/L (ref 0–32)
AST: 29 IU/L (ref 0–40)
Albumin/Globulin Ratio: 1.6 (ref 1.2–2.2)
Albumin: 4.7 g/dL (ref 3.8–4.9)
Alkaline Phosphatase: 119 IU/L (ref 48–121)
BUN/Creatinine Ratio: 21 (ref 9–23)
BUN: 15 mg/dL (ref 6–24)
Bilirubin Total: 0.6 mg/dL (ref 0.0–1.2)
CO2: 25 mmol/L (ref 20–29)
Calcium: 9.6 mg/dL (ref 8.7–10.2)
Chloride: 101 mmol/L (ref 96–106)
Creatinine, Ser: 0.72 mg/dL (ref 0.57–1.00)
GFR calc Af Amer: 112 mL/min/{1.73_m2} (ref >59)
GFR calc non Af Amer: 97 mL/min/{1.73_m2} (ref >59)
Globulin, Total: 2.9 g/dL (ref 1.5–4.5)
Glucose: 86 mg/dL (ref 65–99)
Potassium: 3.5 mmol/L (ref 3.5–5.2)
Sodium: 142 mmol/L (ref 134–144)
Total Protein: 7.6 g/dL (ref 6.0–8.5)

## 2019-06-22 LAB — CBC WITH DIFFERENTIAL/PLATELET
Basophils Absolute: 0 10*3/uL (ref 0.0–0.2)
Basos: 0 %
EOS (ABSOLUTE): 0.2 10*3/uL (ref 0.0–0.4)
Eos: 2 %
Hematocrit: 40.7 % (ref 34.0–46.6)
Hemoglobin: 13.6 g/dL (ref 11.1–15.9)
Immature Grans (Abs): 0 10*3/uL (ref 0.0–0.1)
Immature Granulocytes: 0 %
Lymphocytes Absolute: 2.1 10*3/uL (ref 0.7–3.1)
Lymphs: 29 %
MCH: 30.5 pg (ref 26.6–33.0)
MCHC: 33.4 g/dL (ref 31.5–35.7)
MCV: 91 fL (ref 79–97)
Monocytes Absolute: 0.6 10*3/uL (ref 0.1–0.9)
Monocytes: 8 %
Neutrophils Absolute: 4.3 10*3/uL (ref 1.4–7.0)
Neutrophils: 61 %
Platelets: 162 10*3/uL (ref 150–450)
RBC: 4.46 x10E6/uL (ref 3.77–5.28)
RDW: 12.6 % (ref 11.7–15.4)
WBC: 7.1 10*3/uL (ref 3.4–10.8)

## 2019-06-22 LAB — HCV COMMENT:

## 2019-06-22 LAB — THYROID PANEL WITH TSH
Free Thyroxine Index: 2.2 (ref 1.2–4.9)
T3 Uptake Ratio: 28 % (ref 24–39)
T4, Total: 7.7 ug/dL (ref 4.5–12.0)
TSH: 1.16 u[IU]/mL (ref 0.450–4.500)

## 2019-06-22 LAB — HEPATITIS C ANTIBODY (REFLEX): HCV Ab: <0.1 s/co ratio (ref 0.0–0.9)

## 2019-06-22 LAB — LIPID PANEL
Chol/HDL Ratio: 4.3 ratio (ref 0.0–4.4)
Cholesterol, Total: 214 mg/dL — ABNORMAL HIGH (ref 100–199)
HDL: 50 mg/dL (ref >39)
LDL Chol Calc (NIH): 141 mg/dL — ABNORMAL HIGH (ref 0–99)
Triglycerides: 127 mg/dL (ref 0–149)
VLDL Cholesterol Cal: 23 mg/dL (ref 5–40)

## 2019-06-22 LAB — HIV ANTIBODY (ROUTINE TESTING W REFLEX): HIV Screen 4th Generation wRfx: NONREACTIVE

## 2019-07-13 ENCOUNTER — Other Ambulatory Visit: Payer: Self-pay

## 2019-07-13 DIAGNOSIS — Z1231 Encounter for screening mammogram for malignant neoplasm of breast: Secondary | ICD-10-CM

## 2019-07-19 LAB — FECAL OCCULT BLOOD, IMMUNOCHEMICAL: Fecal Occult Bld: NEGATIVE

## 2019-07-25 ENCOUNTER — Telehealth: Payer: Self-pay

## 2019-07-25 NOTE — Telephone Encounter (Signed)
Pt was here read MD results note to pt.  " Stool screening for colon cancer is negative for blood. Repeat recommended in 1 year" Verbalized understanding

## 2019-08-11 ENCOUNTER — Ambulatory Visit: Payer: No Typology Code available for payment source | Admitting: *Deleted

## 2019-08-11 ENCOUNTER — Other Ambulatory Visit: Payer: Self-pay

## 2019-08-11 ENCOUNTER — Ambulatory Visit
Admission: RE | Admit: 2019-08-11 | Discharge: 2019-08-11 | Disposition: A | Discharge status: Home / Self Care | Payer: No Typology Code available for payment source | Source: Ambulatory Visit | Attending: Obstetrics and Gynecology | Admitting: Obstetrics and Gynecology

## 2019-08-11 VITALS — BP 124/82 | Temp 97.1°F | Wt 227.5 lb

## 2019-08-11 DIAGNOSIS — Z1231 Encounter for screening mammogram for malignant neoplasm of breast: Secondary | ICD-10-CM

## 2019-08-11 DIAGNOSIS — Z01419 Encounter for gynecological examination (general) (routine) without abnormal findings: Secondary | ICD-10-CM

## 2019-08-11 NOTE — Progress Notes (Signed)
Kristen Peters is a 49 y.o. No obstetric history on file. female who presents to [facility] clinic today with no complaints.    Pap Smear: Pap smear completed today. Last Pap smear was 15 years ago at a clinic in [place] and was normal per patient. Per patient has no history of an abnormal Pap smear. Patient has a history of a supracervical hysterectomy when she was 49 years old due to fibroids. Last Pap smear result is not available in Epic.   Physical exam: Breasts Breasts symmetrical. No skin abnormalities bilateral breasts. No nipple retraction bilateral breasts. No nipple discharge bilateral breasts. No lymphadenopathy. No lumps palpated bilateral breasts. No complaints of pain or tenderness on exam.       Pelvic/Bimanual Ext Genitalia No lesions, no swelling and no discharge observed on external genitalia.        Vagina Vagina pink and normal texture. No lesions or discharge observed in vagina.        Cervix Cervix is present. Cervix pink and of normal texture. No discharge observed.    Uterus Uterus is absent due to history of a hysterectomy for benign reasons.     Adnexae Bilateral ovaries present and palpable. No tenderness on palpation.         Rectovaginal No rectal exam completed today since patient had no rectal complaints. No skin abnormalities observed on exam.     Smoking History: Patient has never smoked.   Patient Navigation: Patient education provided. Access to services provided for patient through [facility] program. Spanish interpreter Natale Lay from [facility] provided.   Colorectal Cancer Screening: Per patient has never had colonoscopy completed. Patient completed a FIT Test 07/18/2019 that was negative. No complaints today.    Breast and Cervical Cancer Risk Assessment: Patient has family history of  A paternal aunt having breast cancer. Patient has no known genetic mutations or history of radiation treatment to the chest before age 64. Patient does not  have history of cervical dysplasia, immunocompromised, or DES exposure in-utero.  Risk Assessment    Risk Scores      08/11/2019   Last edited by: Narda Rutherford, LPN   5-year risk: 0.5 %   Lifetime risk: 4.5 %          A: BCCCP exam with pap smear No complaints.  P: Referred patient to the [facility] for a screening mammogram on the mobile unit. Appointment scheduled Thursday, August 11, 2019 at 1000.  Priscille Heidelberg, RN 08/11/2019 9:19 AM

## 2019-08-11 NOTE — Patient Instructions (Signed)
Explained breast self awareness with Shara Blazing. Let patient know BCCCP will cover Pap smears and HPV typing every 5 years unless has a history of abnormal Pap smears. Referred patient to the [facility] for a screening mammogram on the mobile unit. Appointment scheduled Thursday, August 11, 2019 at 1000. Patient aware of appointment and will be there. Let patient know will follow up with her within the next couple weeks with results of Pap smear by letter or phone. Informed patient that the Breast Center will follow-up with her within the next couple of weeks with results of her mammogram by letter or phone. Atalia Litzinger verbalized understanding.  Soumya Colson, Kathaleen Maser, RN 8:47 AM

## 2019-08-15 ENCOUNTER — Other Ambulatory Visit: Payer: Self-pay | Admitting: Obstetrics and Gynecology

## 2019-08-15 DIAGNOSIS — R928 Other abnormal and inconclusive findings on diagnostic imaging of breast: Secondary | ICD-10-CM

## 2019-08-16 ENCOUNTER — Telehealth: Payer: Self-pay

## 2019-08-16 LAB — CYTOLOGY - PAP
Comment: NEGATIVE
Diagnosis: NEGATIVE
High risk HPV: NEGATIVE

## 2019-08-16 NOTE — Telephone Encounter (Signed)
Via Joslyn Hy, Spanish Interpreter, Patient informed negative Pap/HPV results, next pap due in 5 years. Patient was pleased with results, verbalized understanding.

## 2019-08-25 ENCOUNTER — Other Ambulatory Visit: Payer: Self-pay

## 2019-08-25 ENCOUNTER — Ambulatory Visit
Admission: RE | Admit: 2019-08-25 | Discharge: 2019-08-25 | Disposition: A | Discharge status: Home / Self Care | Payer: No Typology Code available for payment source | Source: Ambulatory Visit | Attending: Obstetrics and Gynecology | Admitting: Obstetrics and Gynecology

## 2019-08-25 ENCOUNTER — Other Ambulatory Visit: Payer: Self-pay | Admitting: Obstetrics and Gynecology

## 2019-08-25 DIAGNOSIS — R921 Mammographic calcification found on diagnostic imaging of breast: Secondary | ICD-10-CM

## 2019-08-25 DIAGNOSIS — R928 Other abnormal and inconclusive findings on diagnostic imaging of breast: Secondary | ICD-10-CM

## 2019-09-02 ENCOUNTER — Other Ambulatory Visit: Payer: Self-pay

## 2019-09-02 ENCOUNTER — Ambulatory Visit
Admission: RE | Admit: 2019-09-02 | Discharge: 2019-09-02 | Disposition: A | Discharge status: Home / Self Care | Payer: No Typology Code available for payment source | Source: Ambulatory Visit | Attending: Obstetrics and Gynecology | Admitting: Obstetrics and Gynecology

## 2019-09-02 DIAGNOSIS — R921 Mammographic calcification found on diagnostic imaging of breast: Secondary | ICD-10-CM

## 2019-09-13 ENCOUNTER — Ambulatory Visit
Admission: RE | Admit: 2019-09-13 | Discharge: 2019-09-13 | Disposition: A | Discharge status: Home / Self Care | Payer: No Typology Code available for payment source | Source: Ambulatory Visit | Attending: Obstetrics and Gynecology | Admitting: Obstetrics and Gynecology

## 2019-09-13 ENCOUNTER — Other Ambulatory Visit: Payer: Self-pay | Admitting: Obstetrics and Gynecology

## 2019-09-13 ENCOUNTER — Other Ambulatory Visit: Payer: Self-pay

## 2019-09-13 DIAGNOSIS — N63 Unspecified lump in unspecified breast: Secondary | ICD-10-CM

## 2019-09-21 ENCOUNTER — Ambulatory Visit: Payer: Self-pay | Admitting: Critical Care Medicine

## 2019-10-24 NOTE — Progress Notes (Signed)
Subjective:    Patient ID: Kristen Peters, female    DOB: 23-Jan-1970, 49 y.o.   MRN: [id number]  06/21/19 49 y.o.F here to est care The patient has a history of HTN. Pt had covid in March and received mAB  [facility] This patient has a history of hypertension in the past but had not been on medications in over a month when she was seen in May at the [facility] medicine clinic. She was represcribed Zestoretic 10/12.5 and is taking this daily. Today on arrival blood pressure is 106/70 and is markedly improved from the visit with mobile medicine. The only labs obtained at the mobile medicine visit was a normal glucose and hemoglobin A1c of 5.4 and she needs further lab follow-up here. Patient denies any prior history of known diabetes. She does have elevated weight. She is gone through menopause and she is had increased hot flashes over the past 3 years and does have prior hysterectomy at age of 26. She does take an over-the-counter multivitamin. Today the visit is accomplished with an interpreter in person his name is Thelma Barge. Patient states for breakfast she will eat eggs with either asparagus or spinach for lunch she will usually have fruit salad for supper she will eat beef or chicken or fish along with beans and tortillas  Note the patient is planning on her first Covid vaccine to occur in the next month  10/25/19 Patient is here in return follow-up and recently had a breast examination done and found to have a breast mass in the left breast left upper quadrant ultimately was found to be a hyperplastic lesion fibrocystic in nature.  General surgery recommends follow-up mammogram in 6 months.  The patient was not clear as to these recommendations and wanted further guidance in this.  Also the patient is follow-up on blood pressure medications and is doing well with this blood pressure on arrival good 115/80  Note she did receive her first Covid vaccine a Pfizer and had  severe reactions to this including bone pain headaches weakness and high fever that lasted several days.  She does declining receiving the second vaccine series   Past Medical History:  Diagnosis Date  . Hyperlipidemia   . Hypertension      Family History  Problem Relation Age of Onset  . Diabetes Mother   . Breast cancer Paternal Aunt      Social History   Socioeconomic History  . Marital status: Married    Spouse name: Not on file  . Number of children: 3  . Years of education: Not on file  . Highest education level: 9th grade  Occupational History  . Not on file  Tobacco Use  . Smoking status: Never Smoker  . Smokeless tobacco: Never Used  Vaping Use  . Vaping Use: Never used  Substance and Sexual Activity  . Alcohol use: No  . Drug use: No  . Sexual activity: Yes    Birth control/protection: Surgical  Other Topics Concern  . Not on file  Social History Narrative  . Not on file   Social Determinants of Health   Financial Resource Strain:   . Difficulty of Paying Living Expenses: Not on file  Food Insecurity:   . Worried About Programme researcher, broadcasting/film/video in the Last Year: Not on file  . Ran Out of Food in the Last Year: Not on file  Transportation Needs: No Transportation Needs  . Lack of Transportation (Medical): No  .  Lack of Transportation (Non-Medical): No  Physical Activity:   . Days of Exercise per Week: Not on file  . Minutes of Exercise per Session: Not on file  Stress:   . Feeling of Stress : Not on file  Social Connections:   . Frequency of Communication with Friends and Family: Not on file  . Frequency of Social Gatherings with Friends and Family: Not on file  . Attends Religious Services: Not on file  . Active Member of Clubs or Organizations: Not on file  . Attends Banker Meetings: Not on file  . Marital Status: Not on file  Intimate Partner Violence:   . Fear of Current or Ex-Partner: Not on file  . Emotionally Abused: Not on file   . Physically Abused: Not on file  . Sexually Abused: Not on file     No Known Allergies   Outpatient Medications Prior to Visit  Medication Sig Dispense Refill  . lisinopril-hydrochlorothiazide (ZESTORETIC) 10-12.5 MG tablet Take 1 tablet by mouth daily. 90 tablet 2  . Multiple Vitamin (MULTIVITAMIN) capsule Take 1 capsule by mouth daily.    . Nutritional Supplements (ESTRONATURAL) TABS Take 1 tablet by mouth once.     No facility-administered medications prior to visit.     Review of Systems Constitutional:   No  weight loss, night sweats,  Fevers, chills, fatigue, lassitude. HEENT:   No headaches,  Difficulty swallowing,  Tooth/dental problems,  Sore throat,                No sneezing, itching, ear ache, nasal congestion, post nasal drip,   CV:  No chest pain,  Orthopnea, PND, swelling in lower extremities, anasarca, dizziness, palpitations  GI  No heartburn, indigestion, abdominal pain, nausea, vomiting, diarrhea, change in bowel habits, loss of appetite  Resp: No shortness of breath with exertion or at rest.  No excess mucus, no productive cough,  No non-productive cough,  No coughing up of blood.  No change in color of mucus.  No wheezing.  No chest wall deformity  Skin: no rash or lesions.  GU: no dysuria, change in color of urine, no urgency or frequency.  No flank pain.  MS:  No joint pain or swelling.  No decreased range of motion.  No back pain.  Psych:  No change in mood or affect. No depression or anxiety.  No memory loss.     Objective:   Physical Exam Vitals:   10/25/19 1521  BP: 115/80  Temp: 97.7 F (36.5 C)  Weight: 215 lb (97.5 kg)    Gen: Pleasant, obese, in no distress,  normal affect  ENT: No lesions,  mouth clear,  oropharynx clear, no postnasal drip, teeth are in good condition  Neck: No JVD, no TMG, no carotid bruits  Lungs: No use of accessory muscles, no dullness to percussion, clear without rales or rhonchi  Cardiovascular: RRR, heart  sounds normal, no murmur or gallops, no peripheral edema  Abdomen: soft and NT, no HSM,  BS normal  Musculoskeletal: No deformities, no cyanosis or clubbing  Neuro: alert, non focal  Skin: Warm, no lesions or rashes         Assessment & Plan:  I personally reviewed all images and lab data in the [facility] system as well as any outside material available during this office visit and agree with the  radiology impressions.   Atypical lobular hyperplasia (ALH) of left breast Screening mammogram revealed atypical lobular hyperplasia of the left breast status  post biopsy plan is to repeat mammogram in 6 months  HTN (hypertension) Hypertension well controlled plan will be to continue current medications without change  Hyperlipidemia Moderate mixed hyperlipidemia with we will not require statin therapy as she does not have any primary risk factors she was given a diet to improve her status and also recommended a over-the-counter fish oil   [place] was seen today for follow-up.  Diagnoses and all orders for this visit:  Atypical lobular hyperplasia (ALH) of left breast  Primary hypertension  Moderate mixed hyperlipidemia not requiring statin therapy

## 2019-10-25 ENCOUNTER — Ambulatory Visit: Payer: Self-pay | Attending: Critical Care Medicine | Admitting: Critical Care Medicine

## 2019-10-25 ENCOUNTER — Other Ambulatory Visit: Payer: Self-pay

## 2019-10-25 ENCOUNTER — Encounter: Payer: Self-pay | Admitting: Critical Care Medicine

## 2019-10-25 VITALS — BP 115/80 | Temp 97.7°F | Wt 215.0 lb

## 2019-10-25 DIAGNOSIS — E785 Hyperlipidemia, unspecified: Secondary | ICD-10-CM | POA: Insufficient documentation

## 2019-10-25 DIAGNOSIS — E782 Mixed hyperlipidemia: Secondary | ICD-10-CM

## 2019-10-25 DIAGNOSIS — N6092 Unspecified benign mammary dysplasia of left breast: Secondary | ICD-10-CM

## 2019-10-25 DIAGNOSIS — I10 Essential (primary) hypertension: Secondary | ICD-10-CM

## 2019-10-25 NOTE — Assessment & Plan Note (Signed)
Hypertension well controlled plan will be to continue current medications without change

## 2019-10-25 NOTE — Assessment & Plan Note (Signed)
Moderate mixed hyperlipidemia with we will not require statin therapy as she does not have any primary risk factors she was given a diet to improve her status and also recommended a over-the-counter fish oil

## 2019-10-25 NOTE — Patient Instructions (Signed)
No change in medications  Follow diet below  Dr. Delford Field will contact Dr. Dwain Sarna for records and determine what his recommendations are with regards to the breast lesion in your left breast  Return to see Dr. Delford Field again in 4 months   Plan de alimentacin restringido en grasas y colesterol Fat and Cholesterol Restricted Eating Plan El exceso de grasas y colesterol en la dieta puede causar problemas de [place]. Elegir los alimentos adecuados ayuda a [employer] niveles de grasas y [place]. Esto puede evitarle contraer ciertas enfermedades. El mdico puede recomendarle un plan de alimentacin que incluya lo siguiente:  Grasas totales: ______% o menos del total de caloras por da.  Grasas saturadas: ______% o menos del total de caloras por da.  Colesterol: menos de _________mg Karie Chimera.  Fibra: ______g Karie Chimera. Cules son algunos consejos para seguir este plan? Planificacin de las comidas  En las comidas, [place] su plato en cuatro partes iguales: ? Llene la mitad del plato con verduras y ensaladas de hojas verdes. ? Llene un cuarto del plato con cereales integrales. ? Llene un cuarto del plato con alimentos con protenas con bajo contenido de grasas [employer]).  Coma pescado con alto contenido de grasas omega3 al [employer] veces por semana. Esas grasas se encuentran en la caballa, el atn, las sardinas y el salmn.  Coma alimentos con [address] contenido de [place], como cereales [place], frijoles, [place], [place], [place], guisantes y [place]. Consejos generales   Si necesita adelgazar, consulte a su mdico.  Evite lo siguiente: ? Alimentos con Engineer, mining. ? Comidas fritas. ? Alimentos con aceites parcialmente hidrogenados.  Limite el consumo de alcohol a no ms de por da si es mujer y no est [place], y por da si es hombre. Una medida equivale a 12oz de Financial controller, 5oz de vino o 1oz de bebidas alcohlicas de alta graduacin. Lea  las etiquetas de los alimentos  Lea las etiquetas de los alimentos para conocer lo siguiente: ? Si contienen grasas trans. ? Si contienen aceites parcialmente hidrogenados. ? La cantidad de grasas saturadas (g) que contiene cada porcin. ? La cantidad de colesterol (mg) que contiene cada porcin. ? La cantidad de fibra (g) que contiene cada porcin.  Elija alimentos con grasas saludables, tales como las siguientes: ? Grasas monoinsaturadas. ? Grasas poliinsaturadas. ? Grasas omega3.  Elija productos de cereal que tengan cereales integrales. Busque la palabra "integral" en Estate agent de la lista de ingredientes. Al cocinar  Emplee mtodos de coccin con poca cantidad de grasa. Por ejemplo, hornear, hervir, grillar y Transport planner.  Coma ms comidas caseras. Coma en restaurantes y bares con menos frecuencia.  Evite cocinar usando grasas saturadas, como [place], crema, aceite de [place], aceite de palmiste y aceite de [place]. [place] recomendados  Frutas  Frutas frescas, en conserva (en su jugo natural) o frutas congeladas. Verduras  Verduras frescas o congeladas (crudas, al vapor, asadas o grilladas). Ensaladas de hojas verdes. Granos  Cereales integrales, como panes, galletas, cereales y pastas de [address] o de trigo integral. Avena sin endulzar, trigo bulgur, cebada, quinua o arroz integral. Tortillas de harina de maz o trigo integral. Carnes y otros alimentos ricos en protenas  Carne de res molida (al 85% o ms magra), carne de res de animales alimentados con pastos o carne de res sin la grasa. Pollo o pavo sin piel. Carne de pollo o de [place]. Cerdo sin la grasa. Todos los pescados y frutos de mar. Claras de huevo. Porotos,  guisantes o lentejas secos. Frutos secos o semillas sin sal. Frijoles enlatados sin sal. Mantequillas de frutos secos sin azcar ni aceite agregados. Lcteos  Productos lcteos descremados o semidescremados, como [employer] o al 1%, quesos  reducidos en grasas o al 2%, queso cottage o ricota con bajo contenido de [place] o sin contenido de [place], o yogur natural descremado o semidescremado. Grasas y aceites  Margarina untable que no contenga grasas trans. [place] y condimentos para ensaladas livianos o reducidos en grasas. [place]. Aceites de oliva, canola, ssamo o crtamo. Es posible que los productos que se enumeran ms [place] no constituyan una lista completa de los alimentos y las bebidas que puede tomar. Consulte a un nutricionista para obtener ms informacin. Alimentos que deben evitarse Frutas  Fruta enlatada en almbar espeso. Frutas con salsa de crema o mantequilla. Frutas cocidas en aceite. Verduras  Verduras cocinadas con salsas de queso, crema o mantequilla. Verduras fritas. Granos  Pan blanco. Pastas blancas. Arroz blanco. Pan de maz. Bagels, pasteles y croissants. Galletas saladas y colaciones que contengan grasas trans y aceites hidrogenados. Carnes y otros alimentos ricos en protenas  Cortes de carne con alto contenido de [place]. Costillas, alas de pollo, tocineta, salchicha, mortadela, salame, chinchulines, tocino, perros calientes, salchichas alemanas y embutidos envasados. Hgado y otros rganos. Huevos enteros y yemas de [place]. Pollo y pavo con piel. Carne frita. Lcteos  Leche entera o al 2%, crema, mezcla de [place] y crema, y queso crema. Quesos enteros. Yogur entero o endulzado. Quesos con toda su grasa. [place] no lcteas y coberturas batidas. Quesos procesados, quesos para untar y [place]. Bebidas  Alcohol. Bebidas endulzadas con azcar, como refrescos, limonada y bebidas frutales. Grasas y [address], [place] en barra, [place] de [place], [place], [place] clarificada o grasa de tocino. Aceites de coco, de palmiste y de palma. Dulces y postres  Jarabe de maz, azcares, miel y Radio broadcast assistant. Caramelos. Mermeladas y [place]. Doreen Beam. Cereales endulzados. Galletas, pasteles, bizcochuelos,  donas, muffins y helado. Es posible que los productos que se enumeran ms [place] no constituyan una lista completa de los alimentos y las bebidas que Personnel officer. Consulte a un nutricionista para obtener ms informacin. Resumen  Elegir los alimentos adecuados ayuda a Pharmacologist normales los niveles de grasas y [place]. Esto puede evitarle contraer ciertas enfermedades.  En las comidas, llene la mitad del plato con verduras y ensaladas de hojas verdes.  Coma alimentos con alto contenido de [place], como cereales [place], frijoles, [employer], zanahorias, guisantes y [place].  Limite los alimentos con azcar agregada y grasas saturadas, el alcohol y las comidas fritas. Esta informacin no tiene Theme park manager el consejo del mdico. Asegrese de hacerle al mdico cualquier pregunta que tenga. Document Revised: 08/26/2017 Document Reviewed: 12/10/2016 Elsevier Patient Education  2020 [employer].

## 2019-10-25 NOTE — Assessment & Plan Note (Signed)
Screening mammogram revealed atypical lobular hyperplasia of the left breast status post biopsy plan is to repeat mammogram in 6 months

## 2019-10-27 ENCOUNTER — Telehealth: Payer: Self-pay

## 2019-10-27 NOTE — Telephone Encounter (Signed)
Att to contact pt thru PI interpreters no ans. Vm left for pt to pick up mammogram scholarship so she can be scheduled for diagnostic Lenetta Quaker ordered

## 2020-02-09 ENCOUNTER — Other Ambulatory Visit: Payer: Self-pay

## 2020-02-09 ENCOUNTER — Ambulatory Visit: Payer: Self-pay | Attending: Critical Care Medicine

## 2020-02-15 ENCOUNTER — Other Ambulatory Visit: Payer: Self-pay

## 2020-02-15 ENCOUNTER — Ambulatory Visit: Payer: Self-pay | Attending: Critical Care Medicine

## 2020-02-28 ENCOUNTER — Ambulatory Visit: Payer: No Typology Code available for payment source | Admitting: Critical Care Medicine

## 2020-03-05 ENCOUNTER — Other Ambulatory Visit: Payer: Self-pay

## 2020-03-05 ENCOUNTER — Other Ambulatory Visit: Payer: Self-pay | Admitting: Critical Care Medicine

## 2020-03-05 ENCOUNTER — Ambulatory Visit
Admission: RE | Admit: 2020-03-05 | Discharge: 2020-03-05 | Disposition: A | Discharge status: Home / Self Care | Payer: No Typology Code available for payment source | Source: Ambulatory Visit | Attending: Critical Care Medicine | Admitting: Critical Care Medicine

## 2020-03-05 DIAGNOSIS — N6092 Unspecified benign mammary dysplasia of left breast: Secondary | ICD-10-CM

## 2020-03-13 ENCOUNTER — Encounter: Payer: Self-pay | Admitting: Critical Care Medicine

## 2020-03-13 ENCOUNTER — Ambulatory Visit: Payer: Self-pay | Attending: Critical Care Medicine | Admitting: Critical Care Medicine

## 2020-03-13 ENCOUNTER — Other Ambulatory Visit: Payer: Self-pay

## 2020-03-13 ENCOUNTER — Other Ambulatory Visit: Payer: Self-pay | Admitting: Critical Care Medicine

## 2020-03-13 VITALS — BP 132/84 | HR 81 | Wt 217.2 lb

## 2020-03-13 DIAGNOSIS — N6092 Unspecified benign mammary dysplasia of left breast: Secondary | ICD-10-CM

## 2020-03-13 DIAGNOSIS — E782 Mixed hyperlipidemia: Secondary | ICD-10-CM

## 2020-03-13 DIAGNOSIS — I10 Essential (primary) hypertension: Secondary | ICD-10-CM

## 2020-03-13 DIAGNOSIS — M542 Cervicalgia: Secondary | ICD-10-CM

## 2020-03-13 MED ORDER — LISINOPRIL-HYDROCHLOROTHIAZIDE 10-12.5 MG PO TABS: 1.0000 | ORAL_TABLET | Freq: Every day | ORAL | 2 refills | Status: DC

## 2020-03-13 NOTE — Assessment & Plan Note (Signed)
Hypertension under good control we will refill Zestoretic

## 2020-03-13 NOTE — Assessment & Plan Note (Signed)
Obesity diet given

## 2020-03-13 NOTE — Patient Instructions (Signed)
Please do back exercises as outlined below to help with the neck pain  We discussed the fact that she had a severe reaction to the initial Covid vaccine and therefore you are declining further vaccination.  Please know we now have oral antiviral medications so if you were to become ill please get tested for Covid and if so let us know within the first 5 days of illnesses we can give you these treatments now.  Follow a healthy diet as outlined below to help lose weight including walking at least 30 minutes 3-4 times a week to help get weight off  Stay on your blood pressure medications refills were given  The mammogram office will call you and remind you of your upcoming mammogram end of August of this year  Return to see Dr. Delford FieldWright 6 months  Haga ejercicios para la espalda como se describe a continuacin para ayudar con el dolor de cuello.  Discutimos el hecho de que ella tuvo una reaccin severa a la vacuna Covid inicial y, por lo tanto, est rechazando ms vacunas. Tenga en cuenta que ahora tenemos medicamentos antivirales orales, por lo que si se enferma, hgase la prueba de Covid y, de ser as, infrmenos dentro de los primeros 5 das de la enfermedad, podemos brindarle estos tratamientos ahora.  Siga una dieta Pulte Homessaludable como se describe a continuacin para ayudar a perder peso, incluso caminar al menos 30 minutos 3 o 4 veces por semana para ayudar a perder peso.  Contine con sus medicamentos para la presin arterial.  La oficina de mamografas la llamar y le recordar su prxima mamografa a fines de agosto de [place] ao.  Volver a ver al Dr. Delford FieldWright 6 meses  Prevencin de riesgos para la salud debido al sobrepeso Preventing Health Risks of Being Overweight Mantener un peso corporal saludable es una parte importante de su salud general. Un peso corporal saludable depende de la edad, el sexo y Print production plannerla altura. Tener sobrepeso lo pone en riesgo de sufrir muchos problemas de salud, incluidos los  siguientes:  Enfermedad cardaca.  Diabetes.  Problemas para dormir.  Problemas en las articulaciones. Puede realizar Allied Waste Industriescambios en la dieta y el estilo de vida para evitar estos riesgos. Considere trabajar con un mdico o un nutricionista para Technical brewerrealizar estos cambios. Qu cambios en la alimentacin se pueden realizar?  Coma solo la cantidad que el cuerpo necesita. En la International Business Machinesmayora de los casos, esta cantidad es de aproximadamente 2.000 caloras por da, pero la cantidad vara segn la altura, el sexo y Sorrentoel nivel de [place] Hillactividad. Pregntele al mdico cuntas caloras deber ingerir por da. Comer ms de lo que el cuerpo necesita regularmente puede llevarlo a tener sobrepeso u obesidad.  Coma lentamente y deje de comer cuando se sienta satisfecho.  Elija alimentos saludables, incluidos los siguientes: ? Christmas IslandFrutas y verduras. ? BJ'sCarnes magras. ? Productos lcteos con bajo contenido de grasa. ? Alimentos ricos en fibra, como cereales integrales y frijoles. ? Refrigerios saludables, como bastones de verdura, un trozo de fruta o una cantidad pequea de yogur o Brimhall Nizhoniqueso.  Evite los alimentos y las bebidas con alto contenido de International aid/development workerazcar, sal (sodio), grasas saturadas o grasas trans. Esta puede comprender lo siguiente: ? Muchos postres, como caramelos, galletas y helado. ? Gaseosas. ? Comidas fritas. ? Carnes procesadas, como hot dogs o fiambres. ? Bocadillos envasados.   Qu cambios en el estilo de vida se pueden realizar?  Haga ejercicio durante por lo menos 150 minutos a la semana para Multimedia programmerevitar el  aumento de [place], o con la frecuencia recomendada por su mdico. Haga ejercicios de intensidad moderada, como caminar a paso ligero. ? Distribyalo al hacer ejercicio durante 30 minutos 5 das a la [place], o rfagas cortas de 10 minutos varias veces al da.  Encuentre otras formas de mantenerse activo y Astronomer caloras, como trabajar en el jardn o un pasatiempo que involucre de [place] fsica.  Duerma como  mnimo 8horas todas las noches. Al estar bien descansado, tendr ms probabilidades de estar activo fsicamente y Radio producer elecciones saludables durante Medical laboratory scientific officer. Para dormir mejor: ? Trate de ir a dormir y Chiropodist a Catering manager todos los [place]. ? Mantenga el dormitorio oscuro, silencioso y Holiday representative. ? Asegrese de que la cama sea cmoda. ? Evite las actividades estimulantes, como ver televisin o Materials engineer, durante por lo menos una hora antes de la hora de dormir.   Por qu estos cambios son importantes? Comer de forma saludable y [place] a perder peso y a prevenir los problemas de salud causados por el sobrepeso. Realizar estos cambios tambin puede ayudarlo a Charity fundraiser, sentirse mejor mentalmente y conectarse con amigos y familiares. Qu puede suceder si no se realizan cambios? Tener sobrepeso puede afectarlo durante toda la vida. Puede desarrollar problemas articulares u seos que harn que le resulte difcil o doloroso practicar deportes o realizar actividades que disfruta. Tener sobrepeso hace que el corazn y los pulmones se esfuercen ms y puede provocar problemas de salud, como diabetes, enfermedad cardaca y problemas para dormir. Dnde buscar apoyo Puede obtener apoyo para la prevencin de los riesgos de la salud debido al sobrepeso por parte de:  Su mdico o un nutricionista. Ellos pueden orientarlo en cuanto a las opciones saludables de alimentacin y de [place].  Grupos de apoyo para perder peso en lnea o en persona. Dnde buscar ms informacin  MyPlate: https://ball-collins.biz/ ? Esta una herramienta en lnea que proporciona recomendaciones personalizadas sobre qu alimentos comer cada da.  Centers for Disease Control and Prevention (Centros para el control y la prevencin de enfermedades, CDC): AffordableScrapbook.gl ? Este recurso brinda consejos para Sales executive peso y Warehouse manager un estilo de vida Musician. Resumen  Para evitar el aumento  de peso no saludable, es importante mantener una dieta saludable rica en verduras y cereales integrales, hacer ejercicio con regularidad y dormir al menos 8 horas todas las noches.  Realizar estos cambios ayuda a evitar muchas enfermedades a largo plazo (crnicas) que pueden acortar su vida, como la diabetes, enfermedad cardaca y accidente cerebrovascular. Esta informacin no tiene Theme park manager el consejo del mdico. Asegrese de hacerle al mdico cualquier pregunta que tenga. Document Revised: 04/22/2019 Document Reviewed: 04/22/2019 Elsevier Patient Education  2021 Elsevier Inc.  Ejercicios para la espalda Back Exercises Estos ejercicios ayudan a fortalecer el tronco y la espalda. adems, ayudan a mantener la flexibilidad de la zona lumbar. Hacer estos ejercicios puede ser de ayuda para evitar o Engineer, materials de espalda.  Si tiene dolor de espalda, trate de Museum/gallery exhibitions officer ejercicios 2 o 3veces por da, o como se lo haya indicado el mdico.  A medida que [place], haga los ejercicios una vez por da. Repita los ejercicios con ms frecuencia como se lo haya indicado el mdico.  Para evitar que el dolor regrese, haga los ejercicios una vez por da o como se lo haya indicado el mdico. Ejercicios Rodilla al pecho Repita estos pasos 3 o 5veces seguidas con cada pierna: 1. Acustese boca  arriba sobre una cama dura o sobre el suelo con las piernas extendidas. 2. Lleve una rodilla al pecho. 3. Agarre la rodilla o el muslo con ambas manos y sostngalo en su lugar. 4. Tire de la rodilla hasta sentir una elongacin suave en la parte baja de la espalda o las nalgas. 5. Mantenga la elongacin durante 10 a 30segundos. 6. Suelte y extienda la pierna lentamente. Inclinacin de la pelvis Repita estos pasos 5 o 10veces seguidas: 1. Acustese boca arriba sobre una cama dura o sobre el suelo con las piernas extendidas. 2. Flexione las rodillas de manera que apunten al techo. Los pies deben estar  apoyados en el suelo. 3. Contraiga los msculos de la parte baja del vientre (abdomen) para empujar la zona lumbar contra el suelo. Este movimiento har que el coxis apunte hacia el techo, en lugar de apuntar hacia abajo en direccin a los pies o al suelo. 4. Mantenga esta posicin durante 5 a 10segundos mientras contrae suavemente los msculos y respira con normalidad. El perro y el gato Repita estos pasos hasta que la zona lumbar se curve con ms facilidad: 1. Apoye las palmas de las manos y las rodillas sobre una superficie firme. Las manos deben estar alineadas con los hombros y las rodillas con las caderas. Puede colocarse almohadillas debajo de las rodillas. 2. Deje que la cabeza cuelgue hacia el pecho. Tense (contraiga) los msculos del vientre. Baje el coxis en direccin al suelo de modo que la zona lumbar se arquee como el lomo de un [place]. 3. Mantenga esta posicin durante 5segundos. 4. Levante lentamente la cabeza. Relaje los msculos del vientre. Eleve el coxis de modo que apunte en direccin al techo para que la espalda forme un arco hundido como el lomo de un perro contento. 5. Mantenga esta posicin durante 5segundos.   Flexiones de brazos Repita estos pasos 5 o 10veces seguidas: 1. Acustese boca abajo en el suelo. 2. Ponga las manos cerca de la cabeza, separadas aproximadamente al ancho de los hombros. 3. Con la espalda relajada y las caderas apoyadas en el suelo, extienda lentamente los brazos para levantar la mitad superior del cuerpo y Optometrist los hombros. No use los msculos de la espalda. Puede cambiar la ubicacin de las manos para estar ms cmodo. 4. Mantenga esta posicin durante 5segundos. 5. Lentamente vuelva a la posicin horizontal.   Puentes Repita estos pasos 10veces seguidas: 1. Acustese boca arriba sobre una superficie firme. 2. Flexione las rodillas de manera que apunten al techo. Los pies deben estar apoyados en el suelo. Los brazos deben estar  paralelos a los costados del cuerpo, cerca del cuerpo. 3. Contraiga los glteos y despegue las nalgas del suelo hasta que la cintura est casi a la altura de las rodillas. Si no siente el trabajo muscular en las nalgas y la parte posterior de los muslos, aleje los pies 1 o 2pulgadas (2.5 o 5centmetros) de las nalgas. 4. Mantenga esta posicin durante 3 a 5segundos. 5. Lentamente, vuelva a apoyar las nalgas en el suelo y relaje los glteos. Si este ejercicio le resulta muy fcil, intente realizarlo con los brazos cruzados [place].   Abdominales Repita estos pasos 5 o 10veces seguidas: 1. Acustese boca arriba sobre una cama dura o sobre el suelo con las piernas extendidas. 2. Flexione las rodillas de manera que apunten al techo. Los pies deben estar apoyados en el suelo. 3. Cruce los World [employer] Services Corporation. 4. Baje levemente el mentn en direccin al  pecho, pero no doble el cuello. 5. Contraiga los msculos del abdomen y con lentitud eleve el pecho lo suficiente como para despegar levemente los omplatos del suelo. Evite levantar el cuerpo ms alto que eso, porque puede sobreexigir la zona lumbar. 6. Lentamente baje el pecho y la cabeza hasta el suelo. Elevaciones de espalda Repita estos pasos 5 o 10veces seguidas: 1. Acustese boca abajo con los brazos a los costados y apoye la frente en el suelo. 2. Contraiga los msculos de las piernas y los glteos. 3. Lentamente despegue el pecho del suelo mientras mantiene las caderas apoyadas en el suelo. Mantenga la nuca alineada con la curvatura de la espalda. Mire hacia el suelo mientras hace este ejercicio. 4. Mantenga esta posicin durante 3 a 5segundos. 5. Lentamente baje el pecho y el rostro hasta el suelo. Comunquese con un mdico si:  El dolor de espalda se vuelve mucho ms intenso cuando hace un ejercicio.  El dolor de espalda no mejora 2horas despus de [employer]AM[employer] ejercicios. Si tiene alguno de Limited Brands, deje de Owens [employer] ejercicios. No vuelva a hacer los ejercicios a menos que el mdico lo autorice. Solicite ayuda inmediatamente si:  Siente un dolor sbito y muy intenso en la espalda. Si esto ocurre, deje de Toys [employer] Us. No vuelva a hacer los ejercicios a menos que el mdico lo autorice. Esta informacin no tiene Theme park manager el consejo del mdico. Asegrese de hacerle al mdico cualquier pregunta que tenga. Document Revised: 10/22/2017 Document Reviewed: 10/22/2017 Elsevier Patient Education  2021 Arvin[employer]itor.

## 2020-03-13 NOTE — Assessment & Plan Note (Signed)
Mammogram in February was unchanged repeat mammogram August 2022

## 2020-03-13 NOTE — Assessment & Plan Note (Signed)
Neck pain due to muscle spasticity  Back exercises given for the patient also told to use topical heat and also ibuprofen as needed

## 2020-03-13 NOTE — Progress Notes (Signed)
Follow up.

## 2020-03-13 NOTE — Progress Notes (Signed)
Subjective:    Patient ID: Kristen Peters, female    DOB: 07/12/1970, 50 y.o.   MRN: [id number]  06/21/19 49 y.o.F here to est care The patient has a history of HTN. Pt had covid in March and received mAB  [facility] This patient has a history of hypertension in the past but had not been on medications in over a month when she was [facility]. She was represcribed Zestoretic 10/12.5 and is taking this daily. Today on arrival blood pressure is 106/70 and is markedly improved from the visit with [facility]. The only labs obtained at the [facility] visit was a normal glucose and hemoglobin A1c of 5.4 and she needs further lab follow-up here. Patient denies any prior history of known diabetes. She does have elevated weight. She is gone through menopause and she is had increased hot flashes over the past 3 years and does have prior hysterectomy at age of 62. She does take an over-the-counter multivitamin. Today the visit is accomplished with an interpreter in person his name is Thelma Barge. Patient states for breakfast she will eat eggs with either asparagus or spinach for lunch she will usually have fruit salad for supper she will eat beef or chicken or fish along with beans and tortillas  Note the patient is planning on her first Covid vaccine to occur in the next month  10/25/19 Patient is here in return follow-up and recently had a breast examination done and found to have a breast mass in the left breast left upper quadrant ultimately was found to be a hyperplastic lesion fibrocystic in nature.  General surgery recommends follow-up mammogram in 6 months.  The patient was not clear as to these recommendations and wanted further guidance in this.  Also the patient is follow-up on blood pressure medications and is doing well with this blood pressure on arrival good 115/80  Note she did receive her first Covid vaccine a Pfizer and  had severe reactions to this including bone pain headaches weakness and high fever that lasted several days.  She does declining receiving the second vaccine series  03/13/2020 This is a pleasant female seen today in the clinic face-to-face.  Language barrier was addressed with Spanish interpreter [place].  Patient states she is having mild left neck pain on a scale of 0-10 is a level 3.  Is been going on for 3 weeks.  Its worse if she turns her head to the left but is okay she looks up and down.  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  On arrival blood pressures 132/84 and is stable with Zestoretic.  Weight is also improved as she has lost 10 pounds in weight since August of last year.  Patient has no other complaints at this visit.  Note she did not receive her Covid booster because she had severe reactions to the first Covid vaccine.    Past Medical History:  Diagnosis Date  . Hyperlipidemia   . Hypertension      Family History  Problem Relation Age of Onset  . Diabetes Mother   . Breast cancer Paternal Aunt      Social History   Socioeconomic History  . Marital status: Married    Spouse name: Not on file  . Number of children: 3  .  Years of education: Not on file  . Highest education level: 9th grade  Occupational History  . Not on file  Tobacco Use  . Smoking status: Never Smoker  . Smokeless tobacco: Never Used  Vaping Use  . Vaping Use: Never used  Substance and Sexual Activity  . Alcohol use: No  . Drug use: No  . Sexual activity: Yes    Birth control/protection: Surgical  Other Topics Concern  . Not on file  Social History Narrative  . Not on file   Social Determinants of Health   Financial Resource Strain: Not on file  Food Insecurity: Not on file  Transportation Needs: No Transportation Needs  . Lack of Transportation  (Medical): No  . Lack of Transportation (Non-Medical): No  Physical Activity: Not on file  Stress: Not on file  Social Connections: Not on file  Intimate Partner Violence: Not on file     No Known Allergies   Outpatient Medications Prior to Visit  Medication Sig Dispense Refill  . Multiple Vitamin (MULTIVITAMIN) capsule Take 1 capsule by mouth daily.    . Nutritional Supplements (ESTRONATURAL) TABS Take 1 tablet by mouth once.    Marland Kitchen lisinopril-hydrochlorothiazide (ZESTORETIC) 10-12.5 MG tablet Take 1 tablet by mouth daily. 90 tablet 2   No facility-administered medications prior to visit.     Review of Systems Constitutional:   No  weight loss, night sweats,  Fevers, chills, fatigue, lassitude. HEENT:   No headaches,  Difficulty swallowing,  Tooth/dental problems,  Sore throat,                No sneezing, itching, ear ache, nasal congestion, post nasal drip,   CV:  No chest pain,  Orthopnea, PND, swelling in lower extremities, anasarca, dizziness, palpitations  GI  No heartburn, indigestion, abdominal pain, nausea, vomiting, diarrhea, change in bowel habits, loss of appetite  Resp: No shortness of breath with exertion or at rest.  No excess mucus, no productive cough,  No non-productive cough,  No coughing up of blood.  No change in color of mucus.  No wheezing.  No chest wall deformity  Skin: no rash or lesions.  GU: no dysuria, change in color of urine, no urgency or frequency.  No flank pain.  MS:  No joint pain or swelling.  No decreased range of motion.  No back pain.  NECK PAIN  Psych:  No change in mood or affect. No depression or anxiety.  No memory loss.     Objective:   Physical Exam Vitals:   03/13/20 1552  BP: 132/84  Pulse: 81  SpO2: 98%  Weight: 217 lb 3.2 oz (98.5 kg)    Gen: Pleasant, obese, in no distress,  normal affect  ENT: No lesions,  mouth clear,  oropharynx clear, no postnasal drip, teeth are in good condition  Neck: No JVD, no TMG, no  carotid bruits  Lungs: No use of accessory muscles, no dullness to percussion, clear without rales or rhonchi  Cardiovascular: RRR, heart sounds normal, no murmur or gallops, no peripheral edema  Abdomen: soft and NT, no HSM,  BS normal  Musculoskeletal: No deformities, no cyanosis or clubbing, left greater than right trapezius muscle spasticity in the left neck muscle spasticity  Neuro: alert, non focal  Skin: Warm, no lesions or rashes         Assessment & Plan:  I personally reviewed all images and lab data in the [facility] system as well as any outside material available during this  office visit and agree with the  radiology impressions.   HTN (hypertension) Hypertension under good control we will refill Zestoretic  Atypical lobular hyperplasia [facility]) of left breast Mammogram in February was unchanged repeat mammogram August 2022  Hyperlipidemia Went over necessary dietary changes no medications offered  Obesity, Class III, BMI 40-49.9 (morbid obesity) (HCC) Obesity diet given  Neck pain Neck pain due to muscle spasticity  Back exercises given for the patient also told to use topical heat and also ibuprofen as needed   Denay was seen today for follow-up.  Diagnoses and all orders for this visit:  Essential hypertension -     lisinopril-hydrochlorothiazide (ZESTORETIC) 10-12.5 MG tablet; Take 1 tablet by mouth daily.  Obesity, Class III, BMI 40-49.9 (morbid obesity) (HCC)  Primary hypertension  Atypical lobular hyperplasia (ALH) of left breast  Moderate mixed hyperlipidemia not requiring statin therapy  Neck pain

## 2020-03-13 NOTE — Assessment & Plan Note (Signed)
Went over necessary dietary changes no medications offered

## 2020-04-25 ENCOUNTER — Other Ambulatory Visit: Payer: Self-pay

## 2020-04-25 MED FILL — Lisinopril & Hydrochlorothiazide Tab 10-12.5 MG: ORAL | 90 days supply | Qty: 90 | Fill #0 | Status: AC

## 2020-04-26 ENCOUNTER — Ambulatory Visit: Payer: Self-pay | Attending: Physician Assistant | Admitting: Physician Assistant

## 2020-04-26 ENCOUNTER — Other Ambulatory Visit: Payer: Self-pay

## 2020-04-26 ENCOUNTER — Telehealth: Payer: Self-pay | Admitting: Critical Care Medicine

## 2020-04-26 DIAGNOSIS — J069 Acute upper respiratory infection, unspecified: Secondary | ICD-10-CM

## 2020-04-26 DIAGNOSIS — Z789 Other specified health status: Secondary | ICD-10-CM

## 2020-04-26 MED ORDER — MAGIC MOUTHWASH W/LIDOCAINE
5.0000 mL | Freq: Four times a day (QID) | ORAL | 0 refills | Status: DC | PRN
  Filled 2020-04-26: qty 60, 3d supply, fill #0

## 2020-04-26 MED ORDER — BENZONATATE 100 MG PO CAPS
200.0000 mg | ORAL_CAPSULE | Freq: Two times a day (BID) | ORAL | 0 refills | Status: DC | PRN
  Filled 2020-04-26: qty 40, 10d supply, fill #0

## 2020-04-26 MED ORDER — IBUPROFEN 600 MG PO TABS
600.0000 mg | ORAL_TABLET | Freq: Three times a day (TID) | ORAL | 0 refills | Status: DC | PRN
  Filled 2020-04-26: qty 30, 10d supply, fill #0

## 2020-04-26 NOTE — Progress Notes (Signed)
Virtual Visit via Telephone Note  I connected with Kristen Peters on 04/26/20 at  9:10 AM EDT by telephone and verified that I am speaking with the correct person using two identifiers.  Location: Patient: Home Provider: [facility] office Regan Rakers with pacific interpreters   I discussed the limitations, risks, security and privacy concerns of performing an evaluation and management service by telephone and the availability of in person appointments. I also discussed with the patient that there may be a patient responsible charge related to this service. The patient expressed understanding and agreed to proceed.   History of Present Illness:  2 weeks ago she developed inflammation of her tongue and it felt tender.  Fever started 5 days ago(subjective-she has not checked her temp).  Dry cough.  No N/V/D.  No runny nose.  No loss of taste/smell.  Not worsening  [place]  She will call with the results.     Observations/Objective:  NAD.  A&Ox3.  No gasping/wheezing/coughing during visit   Assessment and Plan: 1. Viral upper respiratory tract infection - magic mouthwash w/lidocaine SOLN; Take 5 mLs by mouth 4 (four) times daily as needed for mouth pain.  Dispense: 60 mL; Refill: 0 - ibuprofen (ADVIL) 600 MG tablet; Take 1 tablet (600 mg total) by mouth every 8 (eight) hours as needed.  Dispense: 30 tablet; Refill: 0 - benzonatate (TESSALON) 100 MG capsule; Take 2 capsules (200 mg total) by mouth 2 (two) times daily as needed for cough.  Dispense: 40 capsule; Refill: 0  2. Language barrier Pacific interpreters used and additional time performing visit was required.   Follow Up Instructions: See PCP in 3 months   I discussed the assessment and treatment plan with the patient. The patient was provided an opportunity to ask questions and all were answered. The patient agreed with the plan and demonstrated an understanding of the  instructions.   The patient was advised to call back or seek an in-person evaluation if the symptoms worsen or if the condition fails to improve as anticipated.  I provided 22 minutes of non-face-to-face time during this encounter.   Georgian Co, PA-C  Patient ID: Kristen Peters, female   DOB: 27-Jun-1970, 50 y.o.   MRN: [id number]

## 2020-04-26 NOTE — Telephone Encounter (Signed)
Thanks for letting me know and Marylene Land since you saw her today pls let the pt know what your treatment plan will be  ty

## 2020-04-26 NOTE — Telephone Encounter (Signed)
Husband of Pt, showed Front Desk an email from [facility] at [address], [place], [place] [address] - for a Covid Test - results state NOT DETECTED as of 04/2020 collected 3:43 , reported at 11:26 pm.  Wanted PCP to be informed of Results. Please advise and thank you.

## 2020-07-24 ENCOUNTER — Ambulatory Visit: Payer: No Typology Code available for payment source | Admitting: Critical Care Medicine

## 2020-07-24 NOTE — Progress Notes (Deleted)
Subjective:    Patient ID: Kristen Peters, female    DOB: 12-Sep-1970, 50 y.o.   MRN: [id number]  06/21/19 49 y.o.F here to est care The patient has a history of HTN. Pt had covid in March and received mAB  [facility] This patient has a history of hypertension in the past but had not been on medications in over a month when she was seen in May at the [facility]. She was represcribed Zestoretic 10/12.5 and is taking this daily. Today on arrival blood pressure is 106/70 and is markedly improved from the visit with [facility]. The only labs obtained at the [facility] visit was a normal glucose and hemoglobin A1c of 5.4 and she needs further lab follow-up here. Patient denies any prior history of known diabetes. She does have elevated weight. She is gone through menopause and she is had increased hot flashes over the past 3 years and does have prior hysterectomy at age of 44. She does take an over-the-counter multivitamin. Today the visit is accomplished with an interpreter in person his name is Thelma Barge. Patient states for breakfast she will eat eggs with either asparagus or spinach for lunch she will usually have fruit salad for supper she will eat beef or chicken or fish along with beans and tortillas  Note the patient is planning on her first Covid vaccine to occur in the next month  10/25/19 Patient is here in return follow-up and recently had a breast examination done and found to have a breast mass in the left breast left upper quadrant ultimately was found to be a hyperplastic lesion fibrocystic in nature.  General surgery recommends follow-up mammogram in 6 months.  The patient was not clear as to these recommendations and wanted further guidance in this.  Also the patient is follow-up on blood pressure medications and is doing well with this blood pressure on arrival good 115/80  Note she did receive her first Covid vaccine a Pfizer and  had severe reactions to this including bone pain headaches weakness and high fever that lasted several days.  She does declining receiving the second vaccine series  03/13/2020 This is a pleasant female seen today in the clinic face-to-face.  Language barrier was addressed with Spanish interpreter Pheba.  Patient states she is having mild left neck pain on a scale of 0-10 is a level 3.  Is been going on for 3 weeks.  Its worse if she turns her head to the left but is okay she looks up and down.  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  On arrival blood pressures 132/84 and is stable with Zestoretic.  Weight is also improved as she has lost 10 pounds in weight since August of last year.  Patient has no other complaints at this visit.  Note she did not receive her Covid booster because she had severe reactions to the first Covid vaccine.  07/24/2020  HTN (hypertension) Hypertension under good control we will refill Zestoretic  Atypical lobular hyperplasia [facility]) of left breast Mammogram in February was unchanged repeat mammogram August 2022  Hyperlipidemia Went over necessary dietary changes no medications offered  Obesity, Class III, BMI 40-49.9 (morbid obesity) (HCC) Obesity diet given  Neck pain Neck pain due to muscle spasticity  Back exercises given for the patient also told to use  topical heat and also ibuprofen as needed     Past Medical History:  Diagnosis Date   Hyperlipidemia    Hypertension      Family History  Problem Relation Age of Onset   Diabetes Mother    Breast cancer Paternal Aunt      Social History   Socioeconomic History   Marital status: Married    Spouse name: Not on file   Number of children: 3   Years of education: Not on file   Highest education level: 9th grade  Occupational History   Not on file   Tobacco Use   Smoking status: Never   Smokeless tobacco: Never  Vaping Use   Vaping Use: Never used  Substance and Sexual Activity   Alcohol use: No   Drug use: No   Sexual activity: Yes    Birth control/protection: Surgical  Other Topics Concern   Not on file  Social History Narrative   Not on file   Social Determinants of Health   Financial Resource Strain: Not on file  Food Insecurity: Not on file  Transportation Needs: No Transportation Needs   Lack of Transportation (Medical): No   Lack of Transportation (Non-Medical): No  Physical Activity: Not on file  Stress: Not on file  Social Connections: Not on file  Intimate Partner Violence: Not on file     No Known Allergies   Outpatient Medications Prior to Visit  Medication Sig Dispense Refill   benzonatate (TESSALON) 100 MG capsule Take 2 capsules (200 mg total) by mouth 2 (two) times daily as needed for cough. 40 capsule 0   ibuprofen (ADVIL) 600 MG tablet Take 1 tablet (600 mg total) by mouth every 8 (eight) hours as needed. 30 tablet 0   lisinopril-hydrochlorothiazide (ZESTORETIC) 10-12.5 MG tablet Take 1 tablet by mouth daily. 90 tablet 2   lisinopril-hydrochlorothiazide (ZESTORETIC) 10-12.5 MG tablet TAKE 1 TABLET BY MOUTH DAILY. 90 tablet 2   lisinopril-hydrochlorothiazide (ZESTORETIC) 10-12.5 MG tablet TAKE 1 TABLET BY MOUTH DAILY. 90 tablet 2   magic mouthwash w/lidocaine SOLN Take 5 mLs by mouth 4 (four) times daily as needed for mouth pain. 60 mL 0   Multiple Vitamin (MULTIVITAMIN) capsule Take 1 capsule by mouth daily.     Nutritional Supplements (ESTRONATURAL) TABS Take 1 tablet by mouth once.     No facility-administered medications prior to visit.     Review of Systems Constitutional:   No  weight loss, night sweats,  Fevers, chills, fatigue, lassitude. HEENT:   No headaches,  Difficulty swallowing,  Tooth/dental problems,  Sore throat,                No sneezing, itching, ear ache, nasal congestion,  post nasal drip,   CV:  No chest pain,  Orthopnea, PND, swelling in lower extremities, anasarca, dizziness, palpitations  GI  No heartburn, indigestion, abdominal pain, nausea, vomiting, diarrhea, change in bowel habits, loss of appetite  Resp: No shortness of breath with exertion or at rest.  No excess mucus, no productive cough,  No non-productive cough,  No coughing up of blood.  No change in color of mucus.  No wheezing.  No chest wall deformity  Skin: no rash or lesions.  GU: no dysuria, change in color of urine, no urgency or frequency.  No flank pain.  MS:  No joint pain or swelling.  No decreased range of motion.  No back pain.  NECK PAIN  Psych:  No change in mood or affect.  No depression or anxiety.  No memory loss.     Objective:   Physical Exam There were no vitals filed for this visit.   Gen: Pleasant, obese, in no distress,  normal affect  ENT: No lesions,  mouth clear,  oropharynx clear, no postnasal drip, teeth are in good condition  Neck: No JVD, no TMG, no carotid bruits  Lungs: No use of accessory muscles, no dullness to percussion, clear without rales or rhonchi  Cardiovascular: RRR, heart sounds normal, no murmur or gallops, no peripheral edema  Abdomen: soft and NT, no HSM,  BS normal  Musculoskeletal: No deformities, no cyanosis or clubbing, left greater than right trapezius muscle spasticity in the left neck muscle spasticity  Neuro: alert, non focal  Skin: Warm, no lesions or rashes         Assessment & Plan:  I personally reviewed all images and lab data in the [facility] system as well as any outside material available during this office visit and agree with the  radiology impressions.   No problem-specific Assessment & Plan notes found for this encounter.   There are no diagnoses linked to this encounter.

## 2020-07-26 ENCOUNTER — Other Ambulatory Visit: Payer: Self-pay

## 2020-07-26 DIAGNOSIS — R921 Mammographic calcification found on diagnostic imaging of breast: Secondary | ICD-10-CM

## 2020-07-26 DIAGNOSIS — Z1231 Encounter for screening mammogram for malignant neoplasm of breast: Secondary | ICD-10-CM

## 2020-07-30 NOTE — Addendum Note (Signed)
Addended by: Lucilla Lame E on: 07/30/2020 11:25 AM   Modules accepted: Orders

## 2020-08-01 ENCOUNTER — Ambulatory Visit: Payer: No Typology Code available for payment source | Admitting: Critical Care Medicine

## 2020-08-16 ENCOUNTER — Other Ambulatory Visit: Payer: Self-pay

## 2020-08-16 ENCOUNTER — Ambulatory Visit: Payer: Self-pay | Attending: Family Medicine

## 2020-08-28 ENCOUNTER — Other Ambulatory Visit: Payer: Self-pay

## 2020-08-28 ENCOUNTER — Ambulatory Visit: Payer: Self-pay | Admitting: *Deleted

## 2020-08-28 VITALS — BP 158/97 | Wt 234.0 lb

## 2020-08-28 DIAGNOSIS — Z1239 Encounter for other screening for malignant neoplasm of breast: Secondary | ICD-10-CM

## 2020-08-28 NOTE — Progress Notes (Signed)
Kristen Peters is a 50 y.o. female who presents to [facility] clinic today with no complaints. Last diagnostic mammogram completed 03/05/2020 with 65-month follow-up recommended.    Pap Smear: Pap smear not completed today. Last Pap smear was 08/11/2019 at [facility] and was normal with negative HPV. Per patient has no history of an abnormal Pap smear. Last Pap smear result is available in Epic.   Physical exam: Breasts Breasts symmetrical. No skin abnormalities bilateral breasts. No nipple retraction bilateral breasts. No nipple discharge bilateral breasts. No lymphadenopathy. No lumps palpated bilateral breasts. No complaints of pain or tenderness on exam.      MM Digital Diagnostic Unilat L  Result Date: 08/25/2019 CLINICAL DATA:  Screening recall for left breast calcifications. EXAM: DIGITAL DIAGNOSTIC LEFT MAMMOGRAM WITH CAD COMPARISON:  Previous exam(s). ACR Breast Density Category c: The breast tissue is heterogeneously dense, which may obscure small masses. FINDINGS: In the upper-outer quadrant of the left breast, anterior depth there is a 1.1 cm group of punctate and amorphous calcifications. Mammographic images were processed with CAD. IMPRESSION: There is an indeterminate 1.1 cm group of calcifications in the upper-outer left breast. RECOMMENDATION: Stereotactic biopsy is recommended, and has been scheduled for 09/02/2019 at 1:45 p.m. I have discussed the findings and recommendations with the patient. If applicable, a reminder letter will be sent to the patient regarding the next appointment. BI-RADS CATEGORY  4: Suspicious. Electronically Signed   By: Frederico Hamman M.D.   On: 08/25/2019 16:09   MM DIAG BREAST TOMO UNI LEFT  Result Date: 03/05/2020 CLINICAL DATA:  50 year old female presents for follow-up of atypical lobular hyperplasia diagnosed following stereotactic guided biopsy of a 1.1 cm group of calcifications in the upper-outer left breast 09/02/2019. EXAM: DIGITAL  DIAGNOSTIC UNILATERAL LEFT MAMMOGRAM WITH TOMOSYNTHESIS AND CAD TECHNIQUE: Left digital diagnostic mammography and breast tomosynthesis was performed. The images were evaluated with computer-aided detection. COMPARISON:  Previous exams. ACR Breast Density Category b: There are scattered areas of fibroglandular density. FINDINGS: No suspicious masses or calcifications are seen in the left breast. A coil shaped biopsy marking clip is present at the site of the biopsied calcifications in the central to slightly upper outer left breast. Two residual calcifications are present at the biopsy site, and similar in appearance when compared to the post biopsy mammogram from 09/02/2019. IMPRESSION: Stable appearance of the left breast following stereotactic guided biopsy with pathology revealing atypical lobular hyperplasia. There are no findings of malignancy. RECOMMENDATION: Bilateral diagnostic mammography in 6 months. I have discussed the findings and recommendations with the patient. If applicable, a reminder letter will be sent to the patient regarding the next appointment. BI-RADS CATEGORY  3: Probably benign. Electronically Signed   By: Edwin Cap M.D.   On: 03/05/2020 16:39   MS DIGITAL SCREENING TOMO BILATERAL  Result Date: 08/13/2019 CLINICAL DATA:  Screening. EXAM: DIGITAL SCREENING BILATERAL MAMMOGRAM WITH TOMO AND CAD COMPARISON:  None. ACR Breast Density Category b: There are scattered areas of fibroglandular density. FINDINGS: In the left breast, calcifications warrant further evaluation. In the right breast, no findings suspicious for malignancy. Images were processed with CAD. IMPRESSION: Further evaluation is suggested for calcifications in the left breast. RECOMMENDATION: Diagnostic mammogram of the left breast. (Code:[id number]) The patient will be contacted regarding the findings, and additional imaging will be scheduled. BI-RADS CATEGORY  0: Incomplete. Need additional imaging evaluation and/or  prior mammograms for comparison. Electronically Signed   By: Frederico Hamman M.D.   On: 08/13/2019 12:24  MM CLIP PLACEMENT LEFT  Result Date: 09/02/2019 CLINICAL DATA:  Status post stereotactic biopsy for LEFT breast calcifications. EXAM: DIAGNOSTIC LEFT MAMMOGRAM POST STEREOTACTIC BIOPSY COMPARISON:  Previous exam(s). FINDINGS: Mammographic images were obtained following stereotactic guided biopsy of calcifications in the upper-outer LEFT breast. The biopsy marking clip is in expected position at the site of biopsy. IMPRESSION: Appropriate positioning of the coil shaped biopsy marking clip at the site of biopsy in the upper-outer LEFT breast. Post clip mammogram shows a prominent postprocedural hematoma at the biopsy site. Compression was applied until hemostasis was obtained. Patient was given instructions to use ice packs and apply firm compression if any additional bleeding was identified after leaving the department. Final Assessment: Post Procedure Mammograms for Marker Placement Electronically Signed   By: Bary Richard M.D.   On: 09/02/2019 14:36   MM LT BREAST BX W LOC DEV 1ST LESION IMAGE BX SPEC STEREO GUIDE  Addendum Date: 09/08/2019   ADDENDUM REPORT: 09/07/2019 08:03 ADDENDUM: Pathology revealed FIBROCYSTIC CHANGES, LOBULAR NEOPLASIA (ATYPICAL LOBULAR HYPERPLASIA) CALCIFICATIONS of the LEFT breast, upper outer quadrant. This was found to be concordant by Dr. Bary Richard, with surgical consultation and High Risk Screening recommended. Pathology results were discussed with the patient by 3 way telephone call with Cecile Hearing Bilingual Patient Services Representative on 09/06/2019. The patient reported doing well after the biopsy with tenderness at the site. Post biopsy instructions and follow up care were reviewed and questions were answered. The patient was encouraged to call [facility] for any additional concerns. Surgical consultation has been arranged with  Dr. Emelia Loron at [facility] on September 23, 2019. Follow up protocol for patients with lobular neoplasia being observed will have diagnostic mammograms for two years (6 month follow up, 6 month follow up, 12 month follow up), then returned to annual screening mammograms. Pathology results reported by Collene Mares RN on 09/07/2019. Electronically Signed   By: Bary Richard M.D.   On: 09/07/2019 08:03   Result Date: 09/08/2019 CLINICAL DATA:  Patient with indeterminate LEFT breast calcifications presents today for stereotactic biopsy. EXAM: LEFT BREAST STEREOTACTIC CORE NEEDLE BIOPSY COMPARISON:  Previous exams. FINDINGS: The patient and I discussed the procedure of stereotactic-guided biopsy including benefits and alternatives. We discussed the high likelihood of a successful procedure. We discussed the risks of the procedure including infection, bleeding, tissue injury, clip migration, and inadequate sampling. Informed written consent was given. The usual time out protocol was performed immediately prior to the procedure. Using sterile technique and 1% Lidocaine as local anesthetic, under stereotactic guidance, a 9 gauge vacuum assisted device was used to perform core needle biopsy of calcifications in the upper outer quadrant of the left breast using a superior approach. Specimen radiograph was performed showing calcifications. Specimens with calcifications are identified for pathology. Lesion quadrant: Upper outer quadrant At the conclusion of the procedure, coil shaped tissue marker clip was deployed into the biopsy cavity. Follow-up 2-view mammogram was performed and dictated separately. IMPRESSION: Stereotactic-guided biopsy of indeterminate calcifications within the upper-outer quadrant of the LEFT breast. No apparent complications. Electronically Signed: By: Bary Richard M.D. On: 09/02/2019 14:23    Pelvic/Bimanual Pap is not indicated today per BCCCP guidelines.   Smoking  History: Patient has never smoked.   Patient Navigation: Patient education provided. Access to services provided for patient through [place] program. Spanish interpreter Natale Lay from [facility] provided.   Colorectal Cancer Screening: Per patient has never had colonoscopy completed. No complaints today.  Breast and Cervical Cancer Risk Assessment: Patient has family history of sister and paternal aunt having breast cancer. Patient has no known genetic mutations or history of radiation treatment to the chest before age 30. Patient does not have history of cervical dysplasia, immunocompromised, or DES exposure in-utero.  Risk Assessment     Risk Scores       08/28/2020 08/11/2019   Last edited by: Meryl Dare, CMA [place], Sherie Demetrius Charity, LPN   5-year risk: 1.2 % 0.5 %   Lifetime risk: 9.9 % 4.5 %            A: BCCCP exam without pap smear No complaints.  P: Referred patient to the [facility] for a diagnostic mammogram per recommendation. Appointment scheduled Monday, September 03, 2020 at 1000.  Priscille Heidelberg, RN 08/28/2020 10:23 AM

## 2020-08-28 NOTE — Patient Instructions (Signed)
Explained breast self awareness with Kristen Peters. Patient did not need a Pap smear today due to last Pap smear and HPV typing was 08/11/2019. Let her know BCCCP will cover Pap smears and HPV typing every 5 years unless has a history of abnormal Pap smears. Referred patient to the [facility] for a diagnostic mammogram per recommendation. Appointment scheduled Monday, September 03, 2020 at 1000. Patient aware of appointment and will be there. Kristen Peters verbalized understanding.  [name], Kathaleen Maser, RN 10:23 AM

## 2020-09-03 ENCOUNTER — Other Ambulatory Visit: Payer: Self-pay

## 2020-09-03 ENCOUNTER — Ambulatory Visit
Admission: RE | Admit: 2020-09-03 | Discharge: 2020-09-03 | Disposition: A | Discharge status: Home / Self Care | Payer: No Typology Code available for payment source | Source: Ambulatory Visit | Attending: Critical Care Medicine | Admitting: Critical Care Medicine

## 2020-09-03 DIAGNOSIS — N6092 Unspecified benign mammary dysplasia of left breast: Secondary | ICD-10-CM

## 2020-09-04 ENCOUNTER — Encounter: Payer: Self-pay | Admitting: Critical Care Medicine

## 2020-09-04 ENCOUNTER — Telehealth: Payer: Self-pay | Admitting: Critical Care Medicine

## 2020-09-04 ENCOUNTER — Ambulatory Visit: Payer: Self-pay | Attending: Critical Care Medicine | Admitting: Critical Care Medicine

## 2020-09-04 VITALS — BP 118/76 | HR 87 | Resp 16 | Wt 234.6 lb

## 2020-09-04 DIAGNOSIS — Z888 Allergy status to other drugs, medicaments and biological substances status: Secondary | ICD-10-CM

## 2020-09-04 DIAGNOSIS — F329 Major depressive disorder, single episode, unspecified: Secondary | ICD-10-CM

## 2020-09-04 DIAGNOSIS — I10 Essential (primary) hypertension: Secondary | ICD-10-CM

## 2020-09-04 DIAGNOSIS — Z1211 Encounter for screening for malignant neoplasm of colon: Secondary | ICD-10-CM

## 2020-09-04 DIAGNOSIS — E782 Mixed hyperlipidemia: Secondary | ICD-10-CM

## 2020-09-04 NOTE — Assessment & Plan Note (Signed)
Repeat lipid panel ?

## 2020-09-04 NOTE — Assessment & Plan Note (Signed)
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

## 2020-09-04 NOTE — Assessment & Plan Note (Signed)
Weight loss diet advised

## 2020-09-04 NOTE — Assessment & Plan Note (Signed)
Significant allergy to angiotensin-converting enzyme inhibitors have placed this in the patient's chart Zestril has been discontinued

## 2020-09-04 NOTE — Telephone Encounter (Signed)
Pt recently lost sister to cancer after brief illness, has severe reactive depression   not suicidal  spanish speaking needs a visit

## 2020-09-04 NOTE — Patient Instructions (Signed)
A tetanus vaccine was given  Labs today include a lipid panel and will be given a fecal occult kit to screen for colon cancer again  Your mammogram was negative another mammogram will be obtained in 1 year  [place] our clinical social worker will contact you for behavioral health counseling regarding your recent loss  Return to see Dr. Joya Gaskins in 4 months

## 2020-09-04 NOTE — Progress Notes (Signed)
Subjective:    Patient ID: Kristen Peters, female    DOB: 1970-12-17, 50 y.o.   MRN: [id number]  06/21/19 49 y.o.F here to est care The patient has a history of HTN. Pt had covid in March and received mAB  [facility] This patient has a history of hypertension in the past but had not been on medications in over a month when she was seen in [facility]. She was represcribed Zestoretic 10/12.5 and is taking this daily. Today on arrival blood pressure is 106/70 and is markedly improved from the visit with [facility]. The only labs obtained at the [facility] visit was a normal glucose and hemoglobin A1c of 5.4 and she needs further lab follow-up here. Patient denies any prior history of known diabetes. She does have elevated weight. She is gone through menopause and she is had increased hot flashes over the past 3 years and does have prior hysterectomy at age of 66. She does take an over-the-counter multivitamin. Today the visit is accomplished with an interpreter in person his name is Thelma Barge. Patient states for breakfast she will eat eggs with either asparagus or spinach for lunch she will usually have fruit salad for supper she will eat beef or chicken or fish along with beans and tortillas  Note the patient is planning on her first Covid vaccine to occur in the next month  10/25/19 Patient is here in return follow-up and recently had a breast examination done and found to have a breast mass in the left breast left upper quadrant ultimately was found to be a hyperplastic lesion fibrocystic in nature.  General surgery recommends follow-up mammogram in 6 months.  The patient was not clear as to these recommendations and wanted further guidance in this.  Also the patient is follow-up on blood pressure medications and is doing well with this blood pressure on arrival good 115/80  Note she did receive her first Covid vaccine a Pfizer and  had severe reactions to this including bone pain headaches weakness and high fever that lasted several days.  She does declining receiving the second vaccine series  03/13/2020 This is a pleasant female seen today in the clinic face-to-face.  Language barrier was addressed with Spanish interpreter [place].  Patient states she is having mild left neck pain on a scale of 0-10 is a level 3.  Is been going on for 3 weeks.  Its worse if she turns her head to the left but is okay she looks up and down.  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  On arrival blood pressures 132/84 and is stable with Zestoretic.  Weight is also improved as she has lost 10 pounds in weight since August of last year.  Patient has no other complaints at this visit.  Note she did not receive her Covid booster because she had severe reactions to the first Covid vaccine.  09/04/2020 Spanish interpretation provided by video interpreter Dorna Mai [phone] Patient's not been seen since March 2022  The patient did not tolerate Zestoretic and developed angioedema and a rash and had to come off the medication.  Since that this is occurred all of her symptoms have resolved since coming off of Zestril.  We will place lisinopril as a high risk medication.  Note on arrival blood pressure is good  at 118/76.  Her weight is gone up some.  She did have a follow-up mammogram recently which showed no change She does not have any hyperplasia that is progressing.  She did suffer a sudden loss of her sister who died 4 days after diagnosis of cancer.  She is suffering quite a bit of anxiety and severe mood change with this.  She is not on medications.  At the patient is due a tetanus vaccine and agrees to receive this.  She does not normally receive flu vaccines and declined that.  PHQ-9 is at 22 at this visit.  Patient states  the neck pain she is had in the past has resolved The patient is due for repeat colon cancer screening   Past Medical History:  Diagnosis Date   Hyperlipidemia    Hypertension      Family History  Problem Relation Age of Onset   Diabetes Mother    Breast cancer Sister    Breast cancer Paternal Aunt      Social History   Socioeconomic History   Marital status: Married    Spouse name: Not on file   Number of children: 3   Years of education: Not on file   Highest education level: 9th grade  Occupational History   Not on file  Tobacco Use   Smoking status: Never   Smokeless tobacco: Never  Vaping Use   Vaping Use: Never used  Substance and Sexual Activity   Alcohol use: No   Drug use: No   Sexual activity: Yes    Birth control/protection: Surgical  Other Topics Concern   Not on file  Social History Narrative   Not on file   Social Determinants of Health   Financial Resource Strain: Not on file  Food Insecurity: No Food Insecurity   Worried About Running Out of Food in the Last Year: Never true   Ran Out of Food in the Last Year: Never true  Transportation Needs: No Transportation Needs   Lack of Transportation (Medical): No   Lack of Transportation (Non-Medical): No  Physical Activity: Not on file  Stress: Not on file  Social Connections: Not on file  Intimate Partner Violence: Not on file     Allergies  Allergen Reactions   Lisinopril-Hydrochlorothiazide Anaphylaxis and Hives     Outpatient Medications Prior to Visit  Medication Sig Dispense Refill   benzonatate (TESSALON) 100 MG capsule Take 2 capsules (200 mg total) by mouth 2 (two) times daily as needed for cough. (Patient not taking: No sig reported) 40 capsule 0   ibuprofen (ADVIL) 600 MG tablet Take 1 tablet (600 mg total) by mouth every 8 (eight) hours as needed. (Patient not taking: No sig reported) 30 tablet 0   lisinopril-hydrochlorothiazide (ZESTORETIC) 10-12.5 MG tablet Take 1 tablet by  mouth daily. (Patient not taking: No sig reported) 90 tablet 2   lisinopril-hydrochlorothiazide (ZESTORETIC) 10-12.5 MG tablet TAKE 1 TABLET BY MOUTH DAILY. (Patient not taking: No sig reported) 90 tablet 2   lisinopril-hydrochlorothiazide (ZESTORETIC) 10-12.5 MG tablet TAKE 1 TABLET BY MOUTH DAILY. (Patient not taking: No sig reported) 90 tablet 2   magic mouthwash w/lidocaine SOLN Take 5 mLs by mouth 4 (four) times daily as needed for mouth pain. (Patient not taking: No sig reported) 60 mL 0   Multiple Vitamin (MULTIVITAMIN) capsule Take 1 capsule by mouth daily. (Patient not taking: Reported on 09/04/2020)     Nutritional Supplements (ESTRONATURAL) TABS Take 1 tablet by mouth once. (Patient not  taking: Reported on 09/04/2020)     No facility-administered medications prior to visit.     Review of Systems  HENT: Negative.    Respiratory: Negative.    Cardiovascular: Negative.   Gastrointestinal: Negative.   Musculoskeletal:  Positive for neck pain.  Psychiatric/Behavioral:  Positive for dysphoric mood.      Objective:   Physical Exam Vitals:   09/04/20 1400  BP: 118/76  Pulse: 87  Resp: 16  SpO2: 95%  Weight: 234 lb 9.6 oz (106.4 kg)    Gen: Pleasant, obese, in no distress,  depressed, tearful  affect  ENT: No lesions,  mouth clear,  oropharynx clear, no postnasal drip, teeth are in good condition  Neck: No JVD, no TMG, no carotid bruits  Lungs: No use of accessory muscles, no dullness to percussion, clear without rales or rhonchi  Cardiovascular: RRR, heart sounds normal, no murmur or gallops, no peripheral edema  Abdomen: soft and NT, no HSM,  BS normal  Musculoskeletal: No deformities, no cyanosis or clubbing, bilateral neck and trapezius muscle spasticity resolved  Neuro: alert, non focal  Skin: Warm, no lesions or rashes    PHQ9 SCORE ONLY 09/04/2020 03/13/2020 10/25/2019  PHQ-9 Total Score 22 0 0        Assessment & Plan:  I personally reviewed all images  and lab data in the [facility] system as well as any outside material available during this office visit and agree with the  radiology impressions.   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  Diagnoses and all orders for this visit:  Moderate mixed hyperlipidemia not requiring statin therapy -     Lipid panel  Colon cancer screening -     Fecal occult blood, imunochemical  Primary hypertension  Allergy to angiotensin-converting enzyme (ACE) inhibitors  Reactive depression  Obesity, Class III, BMI 40-49.9 (morbid obesity) (HCC)

## 2020-09-04 NOTE — Assessment & Plan Note (Signed)
Blood pressure well controlled off all medications will observe for now

## 2020-09-05 LAB — LIPID PANEL
Chol/HDL Ratio: 6 ratio — ABNORMAL HIGH (ref 0.0–4.4)
Cholesterol, Total: 280 mg/dL — ABNORMAL HIGH (ref 100–199)
HDL: 47 mg/dL (ref >39)
LDL Chol Calc (NIH): 174 mg/dL — ABNORMAL HIGH (ref 0–99)
Triglycerides: 306 mg/dL — ABNORMAL HIGH (ref 0–149)
VLDL Cholesterol Cal: 59 mg/dL — ABNORMAL HIGH (ref 5–40)

## 2020-09-06 ENCOUNTER — Other Ambulatory Visit: Payer: Self-pay

## 2020-09-07 NOTE — Telephone Encounter (Signed)
First attempt to reach pt via interpreter, no answer, left vm

## 2020-09-14 NOTE — Telephone Encounter (Signed)
Spoke with pt via interpreter, scheduled appt for 9/28 at 4:30

## 2020-09-18 ENCOUNTER — Telehealth: Payer: Self-pay

## 2020-09-18 NOTE — Telephone Encounter (Signed)
-----   Message from Storm Frisk, MD sent at 09/05/2020  8:21 AM EDT ----- Let pt know here cholesterol remains very high.  I recommend she try omega 3 fish oil one capsule with food daily  this is over the counter

## 2020-09-18 NOTE — Telephone Encounter (Signed)
Patient was called and a voicemail was left informing patient to return phone call for lab results.   A CRM was created to give results when patient returns phone call.

## 2020-10-03 ENCOUNTER — Ambulatory Visit: Payer: Self-pay | Attending: Critical Care Medicine | Admitting: Clinical

## 2020-10-03 ENCOUNTER — Other Ambulatory Visit: Payer: Self-pay

## 2020-10-03 DIAGNOSIS — F4323 Adjustment disorder with mixed anxiety and depressed mood: Secondary | ICD-10-CM

## 2020-10-03 DIAGNOSIS — R45851 Suicidal ideations: Secondary | ICD-10-CM

## 2020-10-09 NOTE — BH Specialist Note (Signed)
Integrated Behavioral Health Initial In-Person Visit  MRN: [id number] Name: Kristen Peters  Number of Integrated Behavioral Health Clinician visits:: 1/6 Session Start time: 4:50pm  Session End time: 5:30pm Total time: 40  minutes  Types of Service: Individual psychotherapy  Interpretor:Yes.   Interpretor Name and Language: Orie Fisherman (Spanish)   Warm Hand Off Completed.        Subjective: Kristen Peters is a 50 y.o. female accompanied by  self Patient was referred by PCP Fanny Dance, MD for grief, depression, and anxiety. Patient reports the following symptoms/concerns: Reports feeling depressed, trouble sleeping, decreased energy, trouble concentrating, self-esteem disturbances, anxiousness, excessive worrying, trouble relaxing, and irritability. Reports that her sister recently passed away two months ago from cancer. Pt reports learning that she had a lump in her breast about one year ago which contributes to daily worries about having cancer. Reports losing her sister increased her worries. Duration of problem: 2 months; Severity of problem: moderate  Objective: Mood: Anxious and Depressed and Affect: Appropriate and Tearful Risk of harm to self or others: Suicidal ideation  Life Context: Family and Social: Pt reports having a strained relationship with her children who are adults.  School/Work: Reports receiving support from her significant other.  Self-Care: Reports spending time with her pets as coping skill. Denies substance use.  Life Changes: Reports that her sister passed away two months ago from cancer. Reports that she has also learned that she has a lump in her breast and has been stressed since her sister passed away.  Patient and/or Family's Strengths/Protective Factors: Concrete supports in place (healthy food, safe environments, etc.)  Goals Addressed: Patient will: Reduce symptoms of: anxiety and depression Increase knowledge  and/or ability of: coping skills  Demonstrate ability to: Begin healthy grieving over loss  Progress towards Goals: Ongoing  Interventions: Interventions utilized: Mindfulness or Management consultant, CBT Cognitive Behavioral Therapy, Supportive Counseling, and Psychoeducation and/or Health Education  Standardized Assessments completed: C-SSRS Short, GAD-7, and PHQ 9  Patient and/or Family Response: Pt receptive to psychoeducation provided on depression, stages of grief, and anxiety. Pt receptive to thought reframing to assist pt with grief and decrease pt worries. Pt had difficulty being receptive to follow up as she did not believe she was ready. Pt was receptive to utilizing crisis resources if needed and utilizing deep breathing exercises. Pt was not receptive to grief journalling.   Patient Centered Plan: Patient is on the following Treatment Plan(s):  Grief, Depression, and Anxiety  Assessment: Endorses passive SI. Denies plan/intent. Pt had difficulty identifying protective factors however has no plan to act on thoughts. Pt provided with crisis resources and pt acknowledged proper precautions to take if SI arises with plan, means, and intent. Denies auditory/visual hallucinations. Patient currently experiencing anxiety and depression as a reaction to grief. Pt is continuing to experience bargaining and depression stages of grief. Pt was unable to attend her sisters funeral due to it being in [place]. Pt appears to have feelings of guilt\.   Patient may benefit from brief therapy to assist with grief. LCSWA provided psychoeducation on depression, grief, and anxiety. LCSWA attempted to normalize pt's difficulty with grief. LCSWA utilized thought reframing to assist with grief. LCSWA encouraged pt to utilize deep breathing exercises. LCSWA also encouraged pt to consider grief journalling however pt was not receptive as she did not believe she was ready. Pt also did not want to fu as she did not  believe she was ready for therapy. LCSWA expressed the importance  of therapy. LCSWA provided pt with [facility] resource and encouraged pt to fu if needed.   Plan: Follow up with behavioral health clinician on : prn Behavioral recommendations: Utilize deep breathing exercises and utilize provided crisis resources if SI arises with plan, means, and intent. Referral(s): Integrated Behavioral Health Services (In Clinic) and [facility] "From scale of 1-10, how likely are you to follow plan?":   Kristen Audi C Mannie [place], LCSW

## 2020-12-14 ENCOUNTER — Telehealth: Payer: Self-pay | Admitting: Critical Care Medicine

## 2020-12-14 NOTE — Telephone Encounter (Signed)
Called pt in reference to her 12/27 appt with Dr. Delford Field. Left msg advising that need to cancel per provider will not be in off and need to reschedule.

## 2021-01-01 ENCOUNTER — Ambulatory Visit: Payer: No Typology Code available for payment source | Admitting: Critical Care Medicine

## 2021-02-10 IMAGING — MG DIGITAL SCREENING BILAT W/ TOMO W/ CAD
8 series · 8 of 24 positions shown · non-contrast
Comparison: None.

CLINICAL DATA: Screening.

EXAM:
DIGITAL SCREENING BILATERAL MAMMOGRAM WITH TOMO AND CAD

[L CC synth-2D]
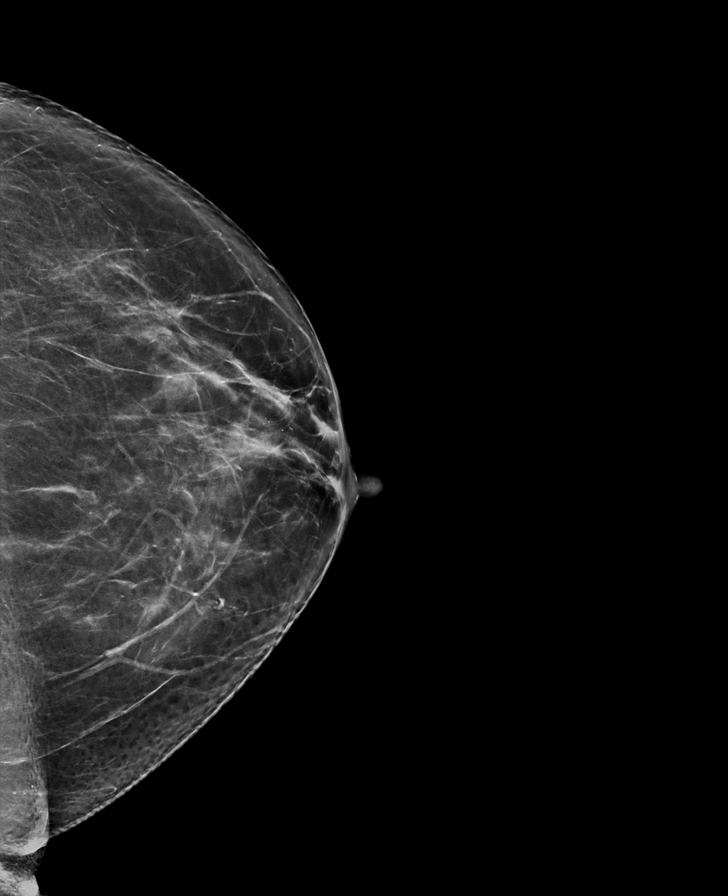

[R CC synth-2D]
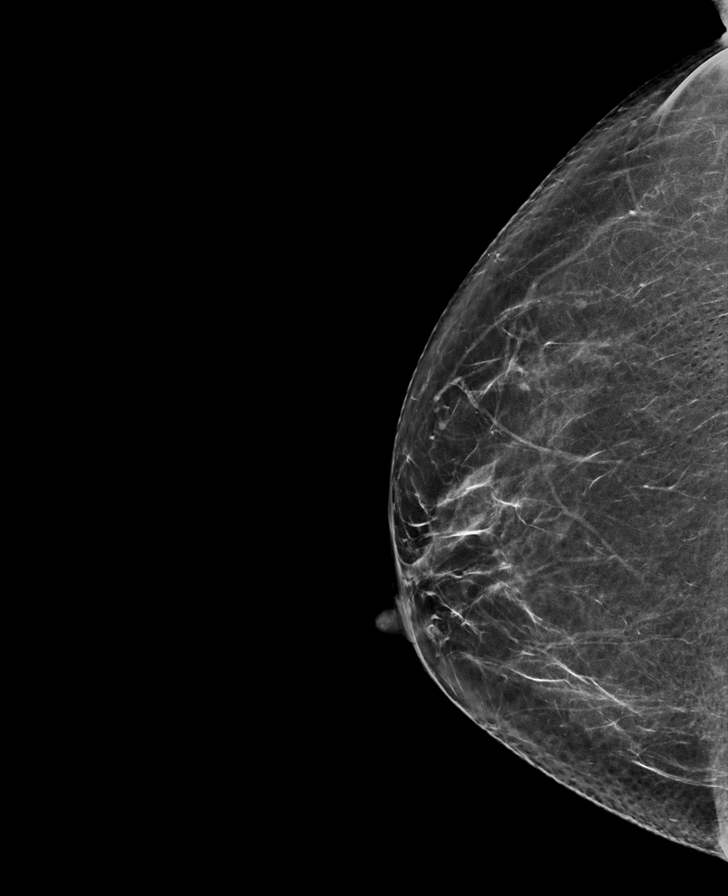

[R MLO synth-2D]
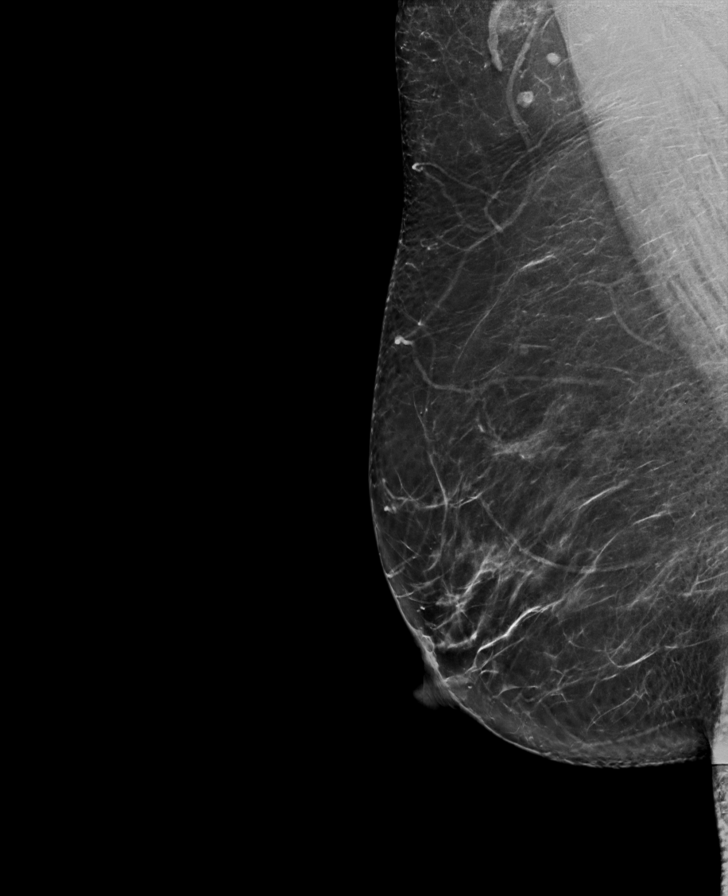

[L MLO synth-2D]
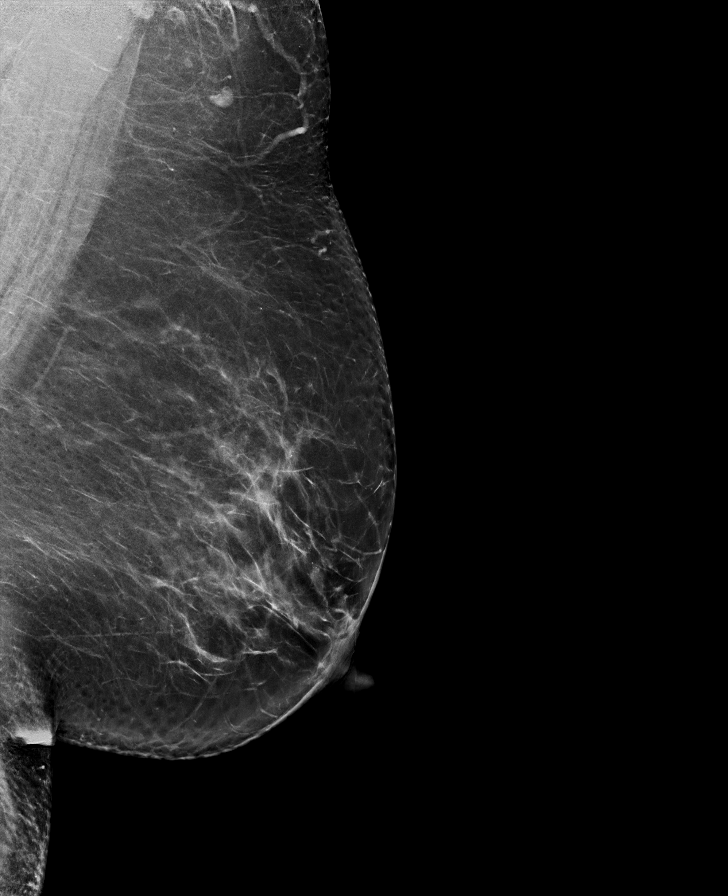

[L MLO tomo · tomo slice 44/87.0]
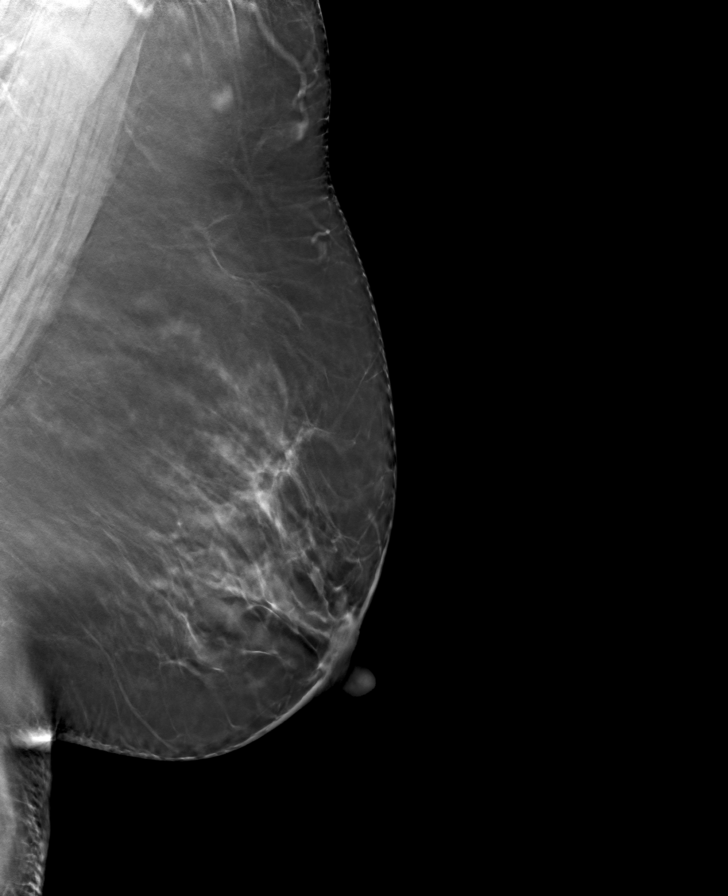

[R CC tomo · tomo slice 36/71.0]
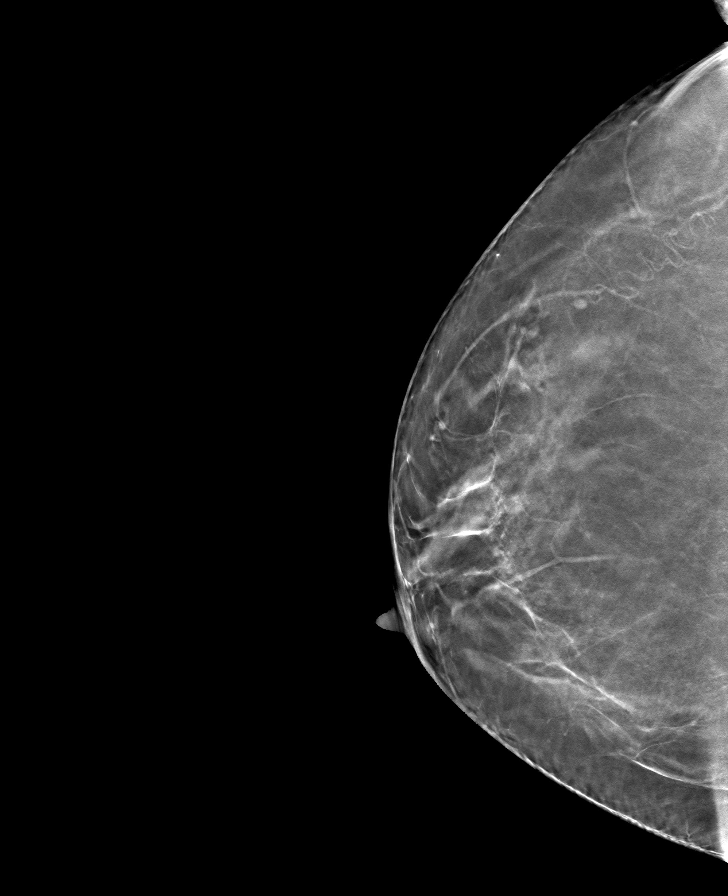

[L CC tomo · tomo slice 37/74.0]
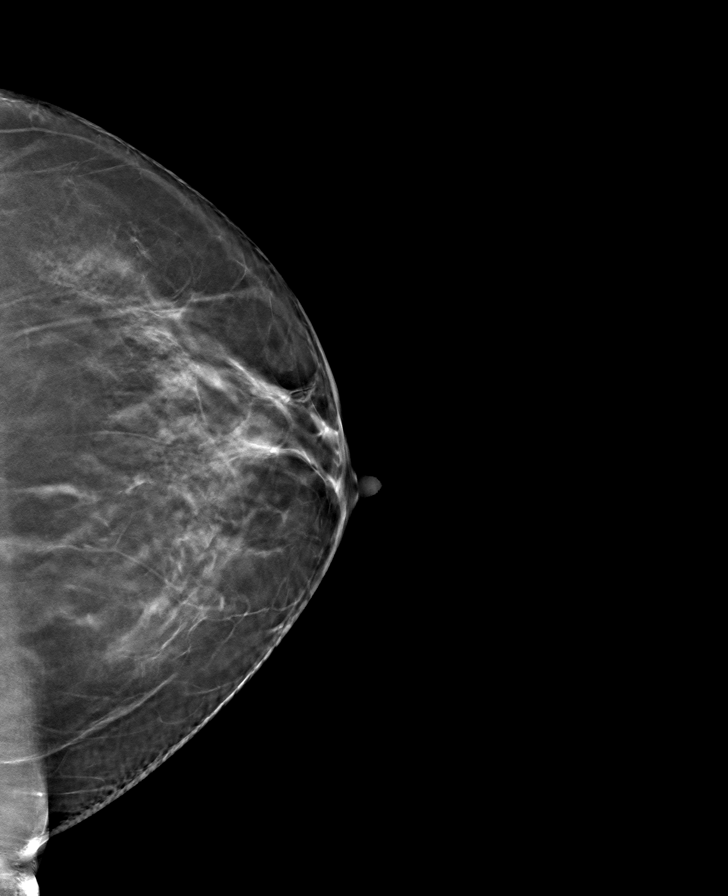

[R MLO tomo · tomo slice 40/79.0]
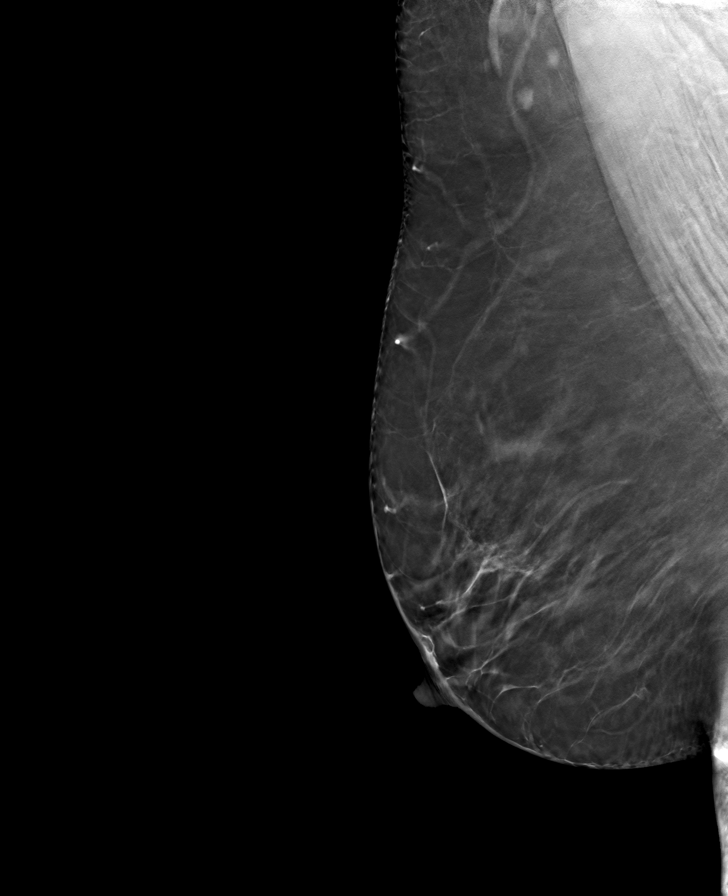

[8 of 24 positions shown; findings below may reference images not displayed]

ACR Breast Density Category b: There are scattered areas of
fibroglandular density.
FINDINGS: In the left breast, calcifications warrant further evaluation. In
the right breast, no findings suspicious for malignancy. Images were
processed with CAD.
IMPRESSION: Further evaluation is suggested for calcifications in the left
breast.

RECOMMENDATION:
Diagnostic mammogram of the left breast. (Code:[id number])

The patient will be contacted regarding the findings, and additional
imaging will be scheduled.

BI-RADS CATEGORY  0: Incomplete. Need additional imaging evaluation
and/or prior mammograms for comparison.

## 2021-06-26 ENCOUNTER — Other Ambulatory Visit: Payer: Self-pay

## 2021-06-26 DIAGNOSIS — N6092 Unspecified benign mammary dysplasia of left breast: Secondary | ICD-10-CM

## 2021-09-19 ENCOUNTER — Ambulatory Visit: Payer: No Typology Code available for payment source
